# Patient Record
Sex: Female | Born: 2000 | Race: White | Hispanic: No | Marital: Married | Smoking: Never smoker
Health system: Southern US, Community
[De-identification: ages and names within clinical notes are randomized; demographics above are authoritative.]

## PROBLEM LIST (undated history)

## (undated) ENCOUNTER — Inpatient Hospital Stay (HOSPITAL_COMMUNITY): Payer: Self-pay

## (undated) DIAGNOSIS — I10 Essential (primary) hypertension: Secondary | ICD-10-CM

## (undated) DIAGNOSIS — T1490XA Injury, unspecified, initial encounter: Secondary | ICD-10-CM

## (undated) DIAGNOSIS — F329 Major depressive disorder, single episode, unspecified: Secondary | ICD-10-CM

## (undated) DIAGNOSIS — E875 Hyperkalemia: Secondary | ICD-10-CM

## (undated) DIAGNOSIS — F32A Depression, unspecified: Secondary | ICD-10-CM

## (undated) DIAGNOSIS — T7422XA Child sexual abuse, confirmed, initial encounter: Secondary | ICD-10-CM

## (undated) DIAGNOSIS — F419 Anxiety disorder, unspecified: Secondary | ICD-10-CM

## (undated) HISTORY — DX: Major depressive disorder, single episode, unspecified: F32.9

## (undated) HISTORY — DX: Essential (primary) hypertension: I10

## (undated) HISTORY — DX: Child sexual abuse, confirmed, initial encounter: T74.22XA

## (undated) HISTORY — DX: Depression, unspecified: F32.A

## (undated) HISTORY — DX: Injury, unspecified, initial encounter: T14.90XA

## (undated) HISTORY — DX: Anxiety disorder, unspecified: F41.9

## (undated) HISTORY — DX: Hyperkalemia: E87.5

## (undated) HISTORY — PX: TYMPANOSTOMY: SHX2586

---
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
## 2020-01-04 DIAGNOSIS — H5213 Myopia, bilateral: Secondary | ICD-10-CM | POA: Diagnosis not present

## 2020-01-15 ENCOUNTER — Other Ambulatory Visit: Payer: Self-pay

## 2020-01-15 ENCOUNTER — Emergency Department (HOSPITAL_COMMUNITY)
Admission: EM | Admit: 2020-01-15 | Discharge: 2020-01-15 | Disposition: A | Discharge status: Home / Self Care | Payer: Medicaid Other | Attending: Emergency Medicine | Admitting: Emergency Medicine

## 2020-01-15 ENCOUNTER — Emergency Department (HOSPITAL_COMMUNITY): Payer: Medicaid Other

## 2020-01-15 ENCOUNTER — Encounter (HOSPITAL_COMMUNITY): Payer: Self-pay

## 2020-01-15 DIAGNOSIS — R102 Pelvic and perineal pain: Secondary | ICD-10-CM | POA: Insufficient documentation

## 2020-01-15 DIAGNOSIS — R197 Diarrhea, unspecified: Secondary | ICD-10-CM

## 2020-01-15 DIAGNOSIS — Z79899 Other long term (current) drug therapy: Secondary | ICD-10-CM | POA: Diagnosis not present

## 2020-01-15 DIAGNOSIS — R112 Nausea with vomiting, unspecified: Secondary | ICD-10-CM | POA: Diagnosis present

## 2020-01-15 DIAGNOSIS — K2900 Acute gastritis without bleeding: Secondary | ICD-10-CM

## 2020-01-15 DIAGNOSIS — R109 Unspecified abdominal pain: Secondary | ICD-10-CM | POA: Diagnosis not present

## 2020-01-15 LAB — URINALYSIS, ROUTINE W REFLEX MICROSCOPIC
Bilirubin Urine: NEGATIVE
Glucose, UA: NEGATIVE mg/dL
Hgb urine dipstick: NEGATIVE
Ketones, ur: NEGATIVE mg/dL
Leukocytes,Ua: NEGATIVE
Nitrite: NEGATIVE
Protein, ur: NEGATIVE mg/dL
Specific Gravity, Urine: 1.024 (ref 1.005–1.030)
pH: 5 (ref 5.0–8.0)

## 2020-01-15 LAB — CBC
HCT: 48.2 % — ABNORMAL HIGH (ref 36.0–46.0)
Hemoglobin: 15.2 g/dL — ABNORMAL HIGH (ref 12.0–15.0)
MCH: 26.3 pg (ref 26.0–34.0)
MCHC: 31.5 g/dL (ref 30.0–36.0)
MCV: 83.5 fL (ref 80.0–100.0)
Platelets: 431 10*3/uL — ABNORMAL HIGH (ref 150–400)
RBC: 5.77 MIL/uL — ABNORMAL HIGH (ref 3.87–5.11)
RDW: 14 % (ref 11.5–15.5)
WBC: 8.7 10*3/uL (ref 4.0–10.5)
nRBC: 0 % (ref 0.0–0.2)

## 2020-01-15 LAB — COMPREHENSIVE METABOLIC PANEL
ALT: 31 U/L (ref 0–44)
AST: 22 U/L (ref 15–41)
Albumin: 4.4 g/dL (ref 3.5–5.0)
Alkaline Phosphatase: 77 U/L (ref 38–126)
Anion gap: 9 (ref 5–15)
BUN: 12 mg/dL (ref 6–20)
CO2: 23 mmol/L (ref 22–32)
Calcium: 9.8 mg/dL (ref 8.9–10.3)
Chloride: 109 mmol/L (ref 98–111)
Creatinine, Ser: 0.82 mg/dL (ref 0.44–1.00)
GFR calc Af Amer: >60 mL/min (ref >60)
GFR calc non Af Amer: >60 mL/min (ref >60)
Glucose, Bld: 95 mg/dL (ref 70–99)
Potassium: 3.9 mmol/L (ref 3.5–5.1)
Sodium: 141 mmol/L (ref 135–145)
Total Bilirubin: 0.5 mg/dL (ref 0.3–1.2)
Total Protein: 8 g/dL (ref 6.5–8.1)

## 2020-01-15 LAB — I-STAT BETA HCG BLOOD, ED (MC, WL, AP ONLY): I-stat hCG, quantitative: <5 m[IU]/mL (ref <5)

## 2020-01-15 LAB — LIPASE, BLOOD: Lipase: 31 U/L (ref 11–51)

## 2020-01-15 MED ORDER — DICYCLOMINE HCL 20 MG PO TABS: 20.0000 mg | ORAL_TABLET | Freq: Two times a day (BID) | ORAL | 0 refills | Status: DC

## 2020-01-15 MED ORDER — SODIUM CHLORIDE 0.9% FLUSH: 3.0000 mL | Freq: Once | INTRAVENOUS | Status: DC

## 2020-01-15 MED ORDER — ONDANSETRON 4 MG PO TBDP: 4.0000 mg | ORAL_TABLET | Freq: Three times a day (TID) | ORAL | 0 refills | Status: DC | PRN

## 2020-01-15 MED ORDER — ONDANSETRON HCL 4 MG/2ML IJ SOLN
4.0000 mg | Freq: Once | INTRAMUSCULAR | Status: AC
  Administered 2020-01-15: 19:00:00 4 mg via INTRAVENOUS
  Filled 2020-01-15: qty 2

## 2020-01-15 MED ORDER — IOHEXOL 300 MG/ML  SOLN
100.0000 mL | Freq: Once | INTRAMUSCULAR | Status: AC | PRN
  Administered 2020-01-15: 19:00:00 100 mL via INTRAVENOUS

## 2020-01-15 MED ORDER — SODIUM CHLORIDE 0.9 % IV BOLUS
1000.0000 mL | Freq: Once | INTRAVENOUS | Status: AC
  Administered 2020-01-15: 19:00:00 1000 mL via INTRAVENOUS

## 2020-01-15 MED ORDER — DICYCLOMINE HCL 10 MG/ML IM SOLN
20.0000 mg | Freq: Once | INTRAMUSCULAR | Status: AC
  Administered 2020-01-15: 20 mg via INTRAMUSCULAR
  Filled 2020-01-15: qty 2

## 2020-01-15 NOTE — ED Triage Notes (Signed)
Pt presents with c/o abdominal pain. Pt reports the pain is at her c-section site and lower abdomen. Pt's c-section was 8 months ago. Pt reports vomiting as well.
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
## 2020-01-15 NOTE — Discharge Instructions (Signed)
Take the Zofran and Bentyl as needed for your symptoms. Slowly advance your diet as tolerated make sure you are drinking plenty of fluids. Return to the ER if you start to experience worsening abdominal pain, diarrhea or vomiting despite taking the medications, shortness of breath or chest pain.

## 2020-03-02 ENCOUNTER — Ambulatory Visit (INDEPENDENT_AMBULATORY_CARE_PROVIDER_SITE_OTHER): Payer: Medicaid Other | Admitting: *Deleted

## 2020-03-02 DIAGNOSIS — Z308 Encounter for other contraceptive management: Secondary | ICD-10-CM

## 2020-03-02 MED ORDER — MEDROXYPROGESTERONE ACETATE 150 MG/ML IM SUSP
150.0000 mg | Freq: Once | INTRAMUSCULAR | Status: AC
  Administered 2020-03-02: 150 mg via INTRAMUSCULAR

## 2020-03-02 NOTE — Progress Notes (Signed)
   NURSE VISIT- INJECTION  SUBJECTIVE:  Erica Colon is a 19 y.o. G89P1001 female here for a Depo Provera for contraception/period management. She is a GYN patient.   OBJECTIVE:  There were no vitals taken for this visit.  Appears well, in no apparent distress  Injection administered in: Right deltoid  No orders of the defined types were placed in this encounter.   ASSESSMENT: GYN patient Depo Provera for contraception/period management PLAN: Follow-up: in 11-13 weeks for next Depo   Stoney Bang  03/02/2020 11:17 AM

## 2020-03-05 ENCOUNTER — Ambulatory Visit: Payer: Medicaid Other

## 2020-03-05 DIAGNOSIS — Z03818 Encounter for observation for suspected exposure to other biological agents ruled out: Secondary | ICD-10-CM | POA: Diagnosis not present

## 2020-03-05 DIAGNOSIS — Z20828 Contact with and (suspected) exposure to other viral communicable diseases: Secondary | ICD-10-CM | POA: Diagnosis not present

## 2020-03-19 ENCOUNTER — Ambulatory Visit: Payer: 59 | Admitting: Adult Health

## 2020-03-26 ENCOUNTER — Encounter: Payer: Self-pay | Admitting: Adult Health

## 2020-03-26 ENCOUNTER — Ambulatory Visit (INDEPENDENT_AMBULATORY_CARE_PROVIDER_SITE_OTHER): Payer: Medicaid Other | Admitting: Adult Health

## 2020-03-26 VITALS — BP 125/81 | HR 89 | Ht 62.0 in | Wt 179.0 lb

## 2020-03-26 DIAGNOSIS — Z3042 Encounter for surveillance of injectable contraceptive: Secondary | ICD-10-CM | POA: Diagnosis not present

## 2020-03-26 DIAGNOSIS — F419 Anxiety disorder, unspecified: Secondary | ICD-10-CM | POA: Insufficient documentation

## 2020-03-26 MED ORDER — SERTRALINE HCL 50 MG PO TABS: 50.0000 mg | ORAL_TABLET | Freq: Every day | ORAL | 12 refills | Status: DC

## 2020-03-26 MED ORDER — MEDROXYPROGESTERONE ACETATE 150 MG/ML IM SUSP: 150.0000 mg | INTRAMUSCULAR | 4 refills | Status: DC

## 2020-03-26 NOTE — Progress Notes (Signed)
  Subjective:     Patient ID: Erica Colon, female   DOB: 2001/03/17, 19 y.o.   MRN: [id number]  HPI Erica Colon is a 19 year old white female, engaged, G1P1 in for 6 month follow up on taking zoloft for postpartum depression, and she stopped a few months ago, not depressed but has anxiety now. PCP is Jannifer Rodney NP.  Review of Systems Patient denies any headaches, hearing loss, blurred vision, shortness of breath, chest pain, abdominal pain, problems with bowel movements, urination, or intercourse. No joint pain or mood swings. Has anxiety, but not depression now, has been off zoloft a few months   + tired today, is working and caring for little girl. She is happy with depo  Objective:   Physical Exam BP 125/81 (BP Location: Left Arm, Patient Position: Sitting, Cuff Size: Normal)   Pulse 89   Ht 5\' 2"  (1.575 m)   Wt 179 lb (81.2 kg)   BMI 32.74 kg/m  Skin warm and dry. Lungs: clear to ausculation bilaterally. Cardiovascular: regular rate and rhythm.   PHQ 2 score is 1.  Assessment:     1. Encounter for surveillance of injectable contraceptive Refilled depo Meds ordered this encounter  Medications  . medroxyPROGESTERone (DEPO-PROVERA) 150 MG/ML injection    Sig: Inject 1 mL (150 mg total) into the muscle every 3 (three) months.    Dispense:  1 mL    Refill:  4    Order Specific Question:   Supervising Provider    Answer:   , LUTHER H [[id number]]  . sertraline (ZOLOFT) 50 MG tablet    Sig: Take 1 tablet (50 mg total) by mouth daily.    Dispense:  30 tablet    Refill:  12    Order Specific Question:   Supervising Provider    Answer:   Despina Hidden H [[id number]]    2. Anxiety Will resume zoloft Follow up with me when gets depo 8/17     Plan:     Follow up 05/25/18 for depo and see me for ROS on zoloft for anxiety

## 2020-05-25 ENCOUNTER — Ambulatory Visit (INDEPENDENT_AMBULATORY_CARE_PROVIDER_SITE_OTHER): Payer: Medicaid Other | Admitting: Adult Health

## 2020-05-25 ENCOUNTER — Encounter: Payer: Self-pay | Admitting: Adult Health

## 2020-05-25 ENCOUNTER — Other Ambulatory Visit: Payer: Self-pay

## 2020-05-25 VITALS — BP 124/76 | HR 82 | Ht 62.0 in | Wt 167.5 lb

## 2020-05-25 DIAGNOSIS — F419 Anxiety disorder, unspecified: Secondary | ICD-10-CM

## 2020-05-25 DIAGNOSIS — Z3042 Encounter for surveillance of injectable contraceptive: Secondary | ICD-10-CM

## 2020-05-25 MED ORDER — MEDROXYPROGESTERONE ACETATE 150 MG/ML IM SUSP
150.0000 mg | Freq: Once | INTRAMUSCULAR | Status: AC
  Administered 2020-05-25: 150 mg via INTRAMUSCULAR

## 2020-05-25 NOTE — Progress Notes (Signed)
  Subjective:     Patient ID: Erica Colon, female   DOB: 2000-10-19, 19 y.o.   MRN: [id number]  HPI Erica Colon is a 19 year old white female,single, G1P1, in today for depo and follow up on starting Zoloft 50 mg for anxiety, and is doing much better. PCP is Erica Rodney NP  Review of Systems Anxiety is much better,on zoloft  Reviewed past medical,surgical, social and family history. Reviewed medications and allergies.     Objective:   Physical Exam BP 124/76 (BP Location: Left Arm, Patient Position: Sitting, Cuff Size: Normal)   Pulse 82   Ht 5\' 2"  (1.575 m)   Wt 167 lb 8 oz (76 kg)   Breastfeeding No   BMI 30.64 kg/m    Skin warm and dry.  Lungs: clear to ausculation bilaterally. Cardiovascular: regular rate and rhythm. PHQ 9 score is 2 She received depo from LPN  Assessment:     1. Encounter for surveillance of injectable contraceptive Pt received depo today, follow up in 12 weeks for next depo  2. Anxiety Continue Zoloft 50 mg      Plan:     Follow up in 6 months with me, or sooner if needed

## 2020-06-16 ENCOUNTER — Telehealth: Payer: Self-pay

## 2020-06-16 NOTE — Telephone Encounter (Signed)
Pain in stomach,constepation then diarherea   , pain with intercourse, started about the time she started the anti-depressant medication.

## 2020-06-16 NOTE — Telephone Encounter (Signed)
Telephoned patient at home number. Patient states having diarrhea, constipation, and also painful intercourse. Advised patient to schedule appointment. Patient to schedule.

## 2020-06-25 ENCOUNTER — Ambulatory Visit (INDEPENDENT_AMBULATORY_CARE_PROVIDER_SITE_OTHER): Payer: Medicaid Other | Admitting: Adult Health

## 2020-06-25 ENCOUNTER — Encounter: Payer: Self-pay | Admitting: Adult Health

## 2020-06-25 VITALS — BP 125/81 | HR 81 | Ht 62.0 in | Wt 166.0 lb

## 2020-06-25 DIAGNOSIS — R309 Painful micturition, unspecified: Secondary | ICD-10-CM | POA: Diagnosis not present

## 2020-06-25 DIAGNOSIS — R102 Pelvic and perineal pain: Secondary | ICD-10-CM | POA: Diagnosis not present

## 2020-06-25 DIAGNOSIS — N941 Unspecified dyspareunia: Secondary | ICD-10-CM | POA: Insufficient documentation

## 2020-06-25 DIAGNOSIS — R110 Nausea: Secondary | ICD-10-CM | POA: Insufficient documentation

## 2020-06-25 DIAGNOSIS — R1013 Epigastric pain: Secondary | ICD-10-CM | POA: Insufficient documentation

## 2020-06-25 LAB — POCT URINALYSIS DIPSTICK
Blood, UA: NEGATIVE
Glucose, UA: NEGATIVE
Ketones, UA: NEGATIVE
Leukocytes, UA: NEGATIVE
Nitrite, UA: NEGATIVE
Protein, UA: NEGATIVE

## 2020-06-25 MED ORDER — DICYCLOMINE HCL 10 MG PO CAPS: 10.0000 mg | ORAL_CAPSULE | Freq: Three times a day (TID) | ORAL | 3 refills | Status: DC

## 2020-06-25 NOTE — Progress Notes (Signed)
  Subjective:     Patient ID: Erica Colon, female   DOB: 12/17/2000, 19 y.o.   MRN: [id number]  HPI Erica Colon is a 19 year old white female, engaged, G1P1 in complaining of pain in abdomen and pelvis and has nausea, with occasional  vomiting and diarrhea or constipation and not associated with anything. Has pain with sex. Has had same partner for 2 years. She stopped zoloft yesterday, thinking it was related to above. No current PCP.  Review of Systems Pain in abdomen and pelvis, nausea with occ. Vomiting and diarrhea or constipation Has pain with sex Reviewed past medical,surgical, social and family history. Reviewed medications and allergies.     Objective:   Physical Exam BP 125/81 (BP Location: Left Arm, Patient Position: Sitting, Cuff Size: Normal)   Pulse 81   Ht 5\' 2"  (1.575 m)   Wt 166 lb (75.3 kg)   BMI 30.36 kg/m    Urine dipstick is negative. Skin warm and dry.Abdomen is soft and has tenderness mid epigastric and LLQ.Pelvic: external genitalia is normal in appearance no lesions, vagina: pink, with good moisture and rugae ,urethra has no lesions or masses noted, cervix:smooth and bulbous, uterus: normal size, shape and contour, non tender, no masses felt, adnexa: no masses or tenderness noted. Bladder is non tender and no masses felt.  Examination chaperoned by LPN   [facility] - 06/25/20 0905      Pregnancy Intention Screening   Does the patient want to become pregnant in the next year? No    Does the patient's partner want to become pregnant in the next year? No    Would the patient like to discuss contraceptive options today? No      Contraception Wrap Up   Current Method Hormonal Injection    End Method Hormonal Injection    Contraception Counseling Provided No             Assessment:     1. Painful urination  2. Dyspareunia in female   3. Pelvic pain Will get pelvic 06/27/20 at [facility] 9/24 at 8:30 am  4. Epigastric pain Will get abdominal 10/24 at  [facility] 9/24 at 8:30 am be NPO  5. Nausea Will try bentyl  Meds ordered this encounter  Medications  . dicyclomine (BENTYL) 10 MG capsule    Sig: Take 1 capsule (10 mg total) by mouth 4 (four) times daily -  before meals and at bedtime.    Dispense:  120 capsule    Refill:  3    Order Specific Question:   Supervising Provider    Answer:   10/24 [[id number]]      Plan:     Restart Zoloft    -follow up in 3 weeks

## 2020-07-02 ENCOUNTER — Other Ambulatory Visit: Payer: Self-pay

## 2020-07-02 ENCOUNTER — Other Ambulatory Visit: Payer: Self-pay | Admitting: Adult Health

## 2020-07-02 ENCOUNTER — Ambulatory Visit (HOSPITAL_COMMUNITY)
Admission: RE | Admit: 2020-07-02 | Discharge: 2020-07-02 | Disposition: A | Discharge status: Home / Self Care | Payer: Medicaid Other | Source: Ambulatory Visit | Attending: Adult Health | Admitting: Adult Health

## 2020-07-02 DIAGNOSIS — R110 Nausea: Secondary | ICD-10-CM

## 2020-07-02 DIAGNOSIS — R1013 Epigastric pain: Secondary | ICD-10-CM | POA: Insufficient documentation

## 2020-07-02 DIAGNOSIS — R102 Pelvic and perineal pain: Secondary | ICD-10-CM | POA: Insufficient documentation

## 2020-07-02 DIAGNOSIS — R309 Painful micturition, unspecified: Secondary | ICD-10-CM | POA: Diagnosis not present

## 2020-07-02 DIAGNOSIS — N941 Unspecified dyspareunia: Secondary | ICD-10-CM | POA: Diagnosis not present

## 2020-07-05 ENCOUNTER — Other Ambulatory Visit: Payer: Self-pay | Admitting: Women's Health

## 2020-07-05 MED ORDER — ORILISSA 200 MG PO TABS: 200.0000 mg | ORAL_TABLET | Freq: Two times a day (BID) | ORAL | 5 refills | Status: DC

## 2020-07-05 NOTE — Progress Notes (Signed)
Rx orilissa 200mg  BID for possible endometrosis pain w/ dyspareunia. Neg pelvic and abd u/s. Has f/u w/ JAG on 10/8. On depo for birth control.  12/8, CNM, [facility] 07/05/2020 2:19 PM

## 2020-07-16 ENCOUNTER — Encounter: Payer: Self-pay | Admitting: Adult Health

## 2020-07-16 ENCOUNTER — Ambulatory Visit (INDEPENDENT_AMBULATORY_CARE_PROVIDER_SITE_OTHER): Payer: Medicaid Other | Admitting: Adult Health

## 2020-07-16 VITALS — BP 109/73 | HR 78 | Ht 62.0 in | Wt 166.0 lb

## 2020-07-16 DIAGNOSIS — R102 Pelvic and perineal pain: Secondary | ICD-10-CM

## 2020-07-16 DIAGNOSIS — N941 Unspecified dyspareunia: Secondary | ICD-10-CM

## 2020-07-16 NOTE — Progress Notes (Signed)
°  Subjective:     Patient ID: Erica Colon, female   DOB: 04/18/2001, 19 y.o.   MRN: [id number]  HPI Erica Colon is a 19 year old white female, single, G1P1001, back in follow up on pelvic pain, nausea and pain with sex. She was placed on Orlissa the end of September and stopped bentyl and did not restart Zoloft, and she still has pain and nausea.  No current PCP.   Review of Systems Still has pelvic pain and nausea  Pain with sex No pain with urination now  but needs to go often and work complains  Reviewed past medical,surgical, social and family history. Reviewed medications and allergies.     Objective:   Physical Exam BP 109/73 (BP Location: Left Arm, Patient Position: Sitting, Cuff Size: Normal)    Pulse 78    Ht 5\' 2"  (1.575 m)    Wt 166 lb (75.3 kg)    BMI 30.36 kg/m  Skin warm and dry.  Lungs: clear to ausculation bilaterally. Cardiovascular: regular rate and rhythm.   Reviewed abdominal and pelvic with her, both were normal.  Assessment:     1. Pelvic pain ?endometriosis On orlissa 200 mg 1 bid   2. Dyspareunia in female Continue orlissa,has refills     Plan:    Restart bentyl Given note for work for frequent bathroom breaks Follow up in 8 weeks or sooner if needed

## 2020-07-21 ENCOUNTER — Other Ambulatory Visit: Payer: Self-pay | Admitting: Adult Health

## 2020-07-21 DIAGNOSIS — R110 Nausea: Secondary | ICD-10-CM

## 2020-07-21 DIAGNOSIS — R1013 Epigastric pain: Secondary | ICD-10-CM

## 2020-07-21 NOTE — Progress Notes (Signed)
Will refer to GI.

## 2020-07-27 ENCOUNTER — Encounter: Payer: Self-pay | Admitting: Internal Medicine

## 2020-08-17 ENCOUNTER — Ambulatory Visit (INDEPENDENT_AMBULATORY_CARE_PROVIDER_SITE_OTHER): Payer: Medicaid Other | Admitting: *Deleted

## 2020-08-17 DIAGNOSIS — Z3042 Encounter for surveillance of injectable contraceptive: Secondary | ICD-10-CM | POA: Diagnosis not present

## 2020-08-17 MED ORDER — MEDROXYPROGESTERONE ACETATE 150 MG/ML IM SUSP
150.0000 mg | Freq: Once | INTRAMUSCULAR | Status: AC
  Administered 2020-08-17: 150 mg via INTRAMUSCULAR

## 2020-08-25 ENCOUNTER — Ambulatory Visit: Payer: Medicaid Other

## 2020-09-01 ENCOUNTER — Encounter: Payer: Self-pay | Admitting: Gastroenterology

## 2020-09-01 ENCOUNTER — Other Ambulatory Visit: Payer: Self-pay

## 2020-09-01 ENCOUNTER — Ambulatory Visit (INDEPENDENT_AMBULATORY_CARE_PROVIDER_SITE_OTHER): Payer: Medicaid Other | Admitting: Gastroenterology

## 2020-09-01 VITALS — BP 128/78 | HR 82 | Temp 97.1°F | Ht 62.0 in | Wt 162.6 lb

## 2020-09-01 DIAGNOSIS — R110 Nausea: Secondary | ICD-10-CM

## 2020-09-01 DIAGNOSIS — R198 Other specified symptoms and signs involving the digestive system and abdomen: Secondary | ICD-10-CM

## 2020-09-01 DIAGNOSIS — R1030 Lower abdominal pain, unspecified: Secondary | ICD-10-CM | POA: Diagnosis not present

## 2020-09-01 DIAGNOSIS — R109 Unspecified abdominal pain: Secondary | ICD-10-CM | POA: Insufficient documentation

## 2020-09-01 MED ORDER — LUBIPROSTONE 24 MCG PO CAPS: 24.0000 ug | ORAL_CAPSULE | Freq: Two times a day (BID) | ORAL | 3 refills | Status: DC

## 2020-09-01 NOTE — Patient Instructions (Signed)
1. Start Amitiza for constipation and possible IBS. You should try taking once daily with food to begin with. If your stools are adequate then continue at this dose. If you are still having constipation, you can increase to twice daily with food.  2. You may have loose stools in the beginning when starting Amitiza. If you do, try to take every other day and stools should even out.  3. Please have your labs done. We will be in touch with results as available.

## 2020-09-01 NOTE — Progress Notes (Signed)
NO PCP PER PATIENT °
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
## 2020-09-10 ENCOUNTER — Ambulatory Visit: Payer: Medicaid Other | Admitting: Adult Health

## 2020-09-22 ENCOUNTER — Ambulatory Visit: Payer: Medicaid Other | Admitting: Adult Health

## 2020-09-29 ENCOUNTER — Ambulatory Visit: Payer: Medicaid Other | Admitting: Adult Health

## 2020-10-19 ENCOUNTER — Ambulatory Visit: Payer: Medicaid Other | Admitting: Adult Health

## 2020-11-03 ENCOUNTER — Other Ambulatory Visit: Payer: Self-pay

## 2020-11-03 ENCOUNTER — Ambulatory Visit (INDEPENDENT_AMBULATORY_CARE_PROVIDER_SITE_OTHER): Payer: Medicaid Other | Admitting: *Deleted

## 2020-11-03 DIAGNOSIS — Z308 Encounter for other contraceptive management: Secondary | ICD-10-CM | POA: Diagnosis not present

## 2020-11-03 MED ORDER — MEDROXYPROGESTERONE ACETATE 150 MG/ML IM SUSP
150.0000 mg | Freq: Once | INTRAMUSCULAR | Status: AC
  Administered 2020-11-03: 150 mg via INTRAMUSCULAR

## 2020-11-03 NOTE — Progress Notes (Signed)
   NURSE VISIT- INJECTION  SUBJECTIVE:  Erica Colon is a 20 y.o. G83P1001 female here for a Depo Provera for contraception/period management. She is a GYN patient.   OBJECTIVE:  There were no vitals taken for this visit.  Appears well, in no apparent distress  Injection administered in: Right deltoid  Meds ordered this encounter  Medications  . medroxyPROGESTERone (DEPO-PROVERA) injection 150 mg    ASSESSMENT: GYN patient Depo Provera for contraception/period management PLAN: Follow-up: in 11-13 weeks for next Depo   [name]  11/03/2020 10:52 AM

## 2020-11-25 DIAGNOSIS — R110 Nausea: Secondary | ICD-10-CM | POA: Diagnosis not present

## 2020-11-25 DIAGNOSIS — R198 Other specified symptoms and signs involving the digestive system and abdomen: Secondary | ICD-10-CM | POA: Diagnosis not present

## 2020-11-25 DIAGNOSIS — R1030 Lower abdominal pain, unspecified: Secondary | ICD-10-CM | POA: Diagnosis not present

## 2020-11-26 LAB — IGA: IgA/Immunoglobulin A, Serum: 207 mg/dL (ref 87–352)

## 2020-11-26 LAB — CBC WITH DIFFERENTIAL/PLATELET
Basophils Absolute: 0.1 10*3/uL (ref 0.0–0.2)
Basos: 1 %
EOS (ABSOLUTE): 0.1 10*3/uL (ref 0.0–0.4)
Eos: 1 %
Hematocrit: 47.5 % — ABNORMAL HIGH (ref 34.0–46.6)
Hemoglobin: 15.6 g/dL (ref 11.1–15.9)
Immature Grans (Abs): 0 10*3/uL (ref 0.0–0.1)
Immature Granulocytes: 0 %
Lymphocytes Absolute: 3.4 10*3/uL — ABNORMAL HIGH (ref 0.7–3.1)
Lymphs: 39 %
MCH: 27.9 pg (ref 26.6–33.0)
MCHC: 32.8 g/dL (ref 31.5–35.7)
MCV: 85 fL (ref 79–97)
Monocytes Absolute: 0.9 10*3/uL (ref 0.1–0.9)
Monocytes: 10 %
Neutrophils Absolute: 4.2 10*3/uL (ref 1.4–7.0)
Neutrophils: 49 %
Platelets: 406 10*3/uL (ref 150–450)
RBC: 5.59 x10E6/uL — ABNORMAL HIGH (ref 3.77–5.28)
RDW: 13.9 % (ref 11.7–15.4)
WBC: 8.6 10*3/uL (ref 3.4–10.8)

## 2020-11-26 LAB — TSH+FREE T4
Free T4: 1.36 ng/dL (ref 0.93–1.60)
TSH: 1.34 u[IU]/mL (ref 0.450–4.500)

## 2020-11-26 LAB — C-REACTIVE PROTEIN: CRP: 3 mg/L (ref 0–10)

## 2020-11-26 LAB — SEDIMENTATION RATE: Sed Rate: 15 mm/hr (ref 0–32)

## 2020-11-26 LAB — TISSUE TRANSGLUTAMINASE, IGA: Transglutaminase IgA: <2 U/mL (ref 0–3)

## 2021-01-26 ENCOUNTER — Other Ambulatory Visit: Payer: Self-pay

## 2021-01-26 ENCOUNTER — Ambulatory Visit (INDEPENDENT_AMBULATORY_CARE_PROVIDER_SITE_OTHER): Payer: Medicaid Other | Admitting: *Deleted

## 2021-01-26 DIAGNOSIS — Z3042 Encounter for surveillance of injectable contraceptive: Secondary | ICD-10-CM

## 2021-01-26 MED ORDER — MEDROXYPROGESTERONE ACETATE 150 MG/ML IM SUSP
150.0000 mg | Freq: Once | INTRAMUSCULAR | Status: AC
  Administered 2021-01-26: 150 mg via INTRAMUSCULAR

## 2021-01-26 NOTE — Progress Notes (Signed)
   NURSE VISIT- INJECTION  SUBJECTIVE:  Erica Colon is a 20 y.o. G49P1001 female here for a Depo Provera for contraception/period management. She is a GYN patient.   OBJECTIVE:  There were no vitals taken for this visit.  Appears well, in no apparent distress  Injection administered in: Right deltoid  Meds ordered this encounter  Medications  . medroxyPROGESTERone (DEPO-PROVERA) injection 150 mg    ASSESSMENT: GYN patient Depo Provera for contraception/period management PLAN: Follow-up: in 11-13 weeks for next Depo   Annamarie Dawley  01/26/2021 10:38 AM

## 2021-02-22 ENCOUNTER — Telehealth: Payer: Self-pay | Admitting: Adult Health

## 2021-02-22 NOTE — Telephone Encounter (Signed)
Pt is on depo, has been for 2 years Went to bathroom today & saw blood States no cycle since being on depo Looked like dry blood & some spotting when she wiped  Requested a call from [place]  Please advise & notify pt

## 2021-04-19 ENCOUNTER — Other Ambulatory Visit: Payer: Self-pay | Admitting: Adult Health

## 2021-04-19 ENCOUNTER — Telehealth: Payer: Self-pay | Admitting: Adult Health

## 2021-04-19 NOTE — Telephone Encounter (Signed)
Pt needs refill on her depo, is scheduled to get the injection tomorrow  Please advise & notify pt  (Routed to [place] due to short on nurses)   [facility]

## 2021-04-19 NOTE — Telephone Encounter (Signed)
Pt aware that [facility]

## 2021-04-20 ENCOUNTER — Other Ambulatory Visit: Payer: Self-pay

## 2021-04-20 ENCOUNTER — Ambulatory Visit (INDEPENDENT_AMBULATORY_CARE_PROVIDER_SITE_OTHER): Payer: Medicaid Other | Admitting: *Deleted

## 2021-04-20 DIAGNOSIS — Z308 Encounter for other contraceptive management: Secondary | ICD-10-CM | POA: Diagnosis not present

## 2021-04-20 DIAGNOSIS — Z3202 Encounter for pregnancy test, result negative: Secondary | ICD-10-CM | POA: Diagnosis not present

## 2021-04-20 LAB — POCT URINE PREGNANCY: Preg Test, Ur: NEGATIVE

## 2021-04-20 MED ORDER — MEDROXYPROGESTERONE ACETATE 150 MG/ML IM SUSP
150.0000 mg | Freq: Once | INTRAMUSCULAR | Status: AC
  Administered 2021-04-20: 150 mg via INTRAMUSCULAR

## 2021-04-20 NOTE — Progress Notes (Signed)
   NURSE VISIT- INJECTION  SUBJECTIVE:  Erica Colon is a 20 y.o. G45P1001 female here for a Depo Provera for contraception/period management. She is a GYN patient.   OBJECTIVE:  There were no vitals taken for this visit.  Appears well, in no apparent distress. Pt mentioned having nausea, stomach and back pain. She requested a pregnancy test. UPT negative in office.  Injection administered in: Left deltoid  Meds ordered this encounter  Medications   medroxyPROGESTERone (DEPO-PROVERA) injection 150 mg    ASSESSMENT: GYN patient Depo Provera for contraception/period management PLAN: Follow-up: in 11-13 weeks for next Depo   [name]  04/20/2021 10:26 AM

## 2021-07-07 IMAGING — CT CT ABD-PELV W/ CM
2 of 4 series · 16 of 46 positions shown, 18 images · IV contrast (omnipaque)
Comparison: None.

CLINICAL DATA: 18-year-old female with right lower quadrant
abdominal pain.

EXAM:
CT ABDOMEN AND PELVIS WITH CONTRAST
TECHNIQUE: Multidetector CT imaging of the abdomen and pelvis was performed
using the standard protocol following bolus administration of
intravenous contrast.
CONTRAST:  100mL OMNIPAQUE IOHEXOL 300 MG/ML  SOLN

[Series 2: axial st · axial · 0.68mm/px · z∈[+914,+1334]mm · 13 of 94 slices shown, 15 images]
[im 5/94  soft-tissue]
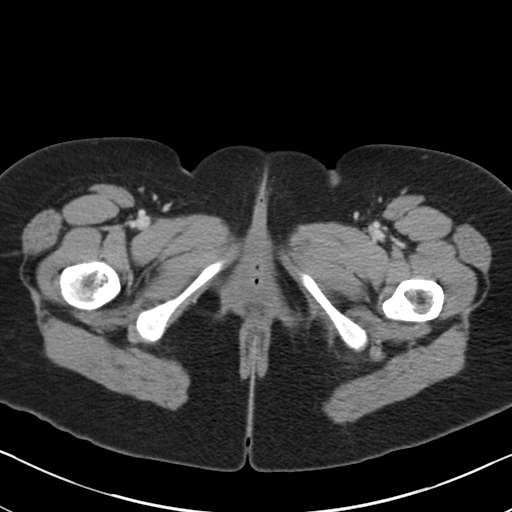
[im 5/94  bone]
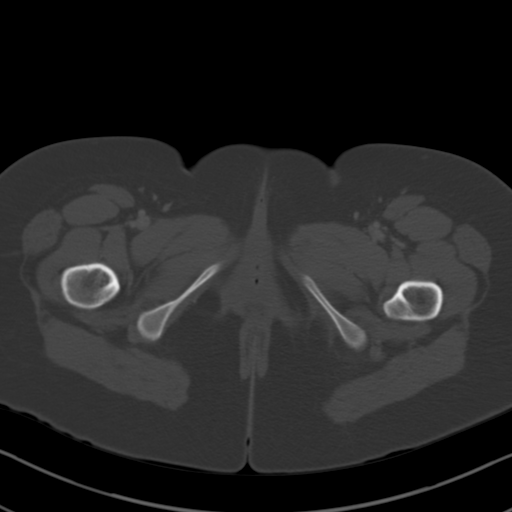
[im 14/94  soft-tissue]
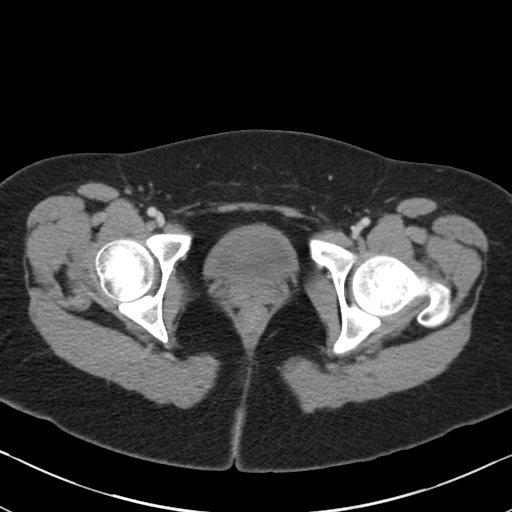
[im 19/94  soft-tissue]
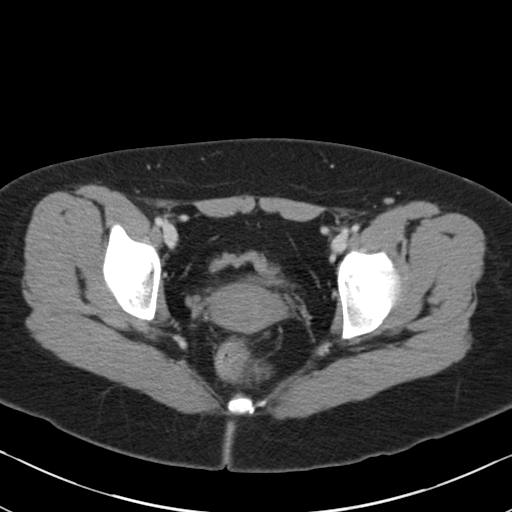
[im 28/94  soft-tissue]
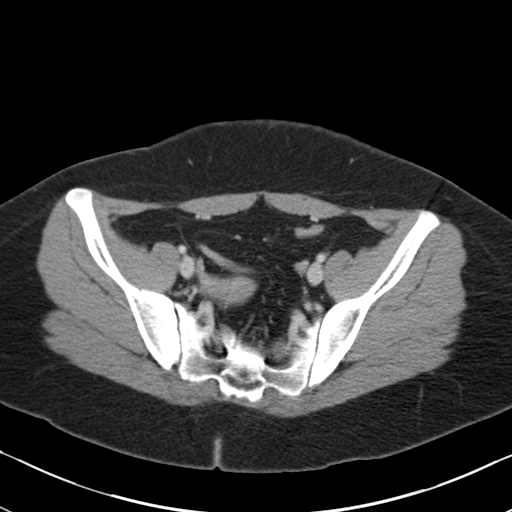
[im 33/94  soft-tissue]
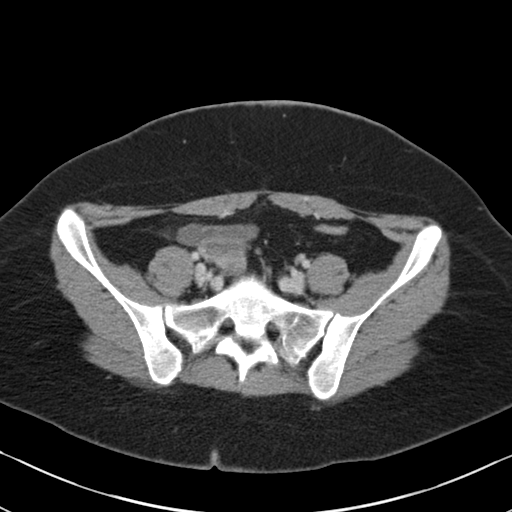
[im 42/94  soft-tissue]
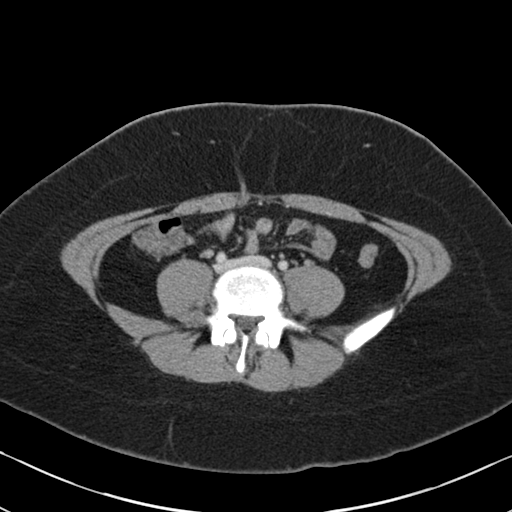
[im 47/94  soft-tissue]
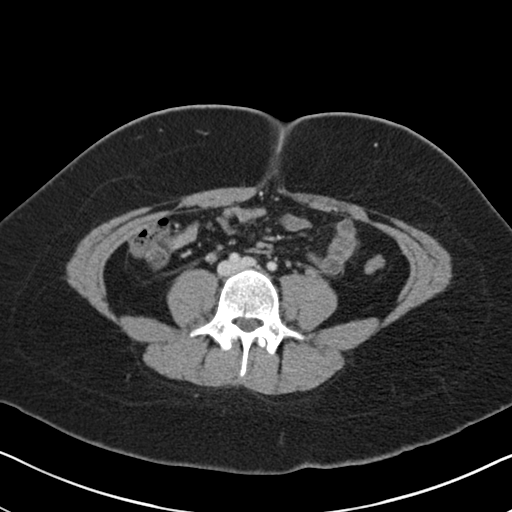
[im 52/94  soft-tissue]
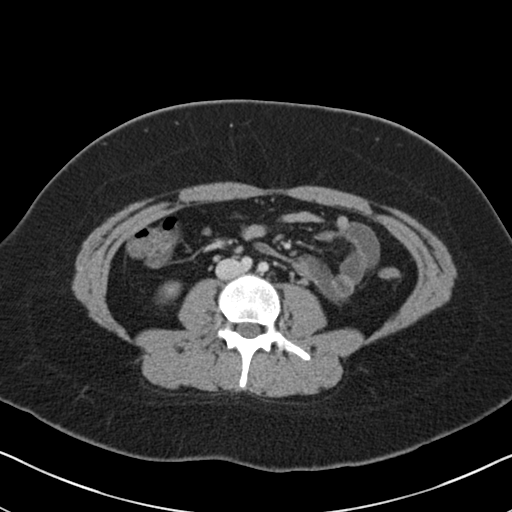
[im 61/94  soft-tissue]
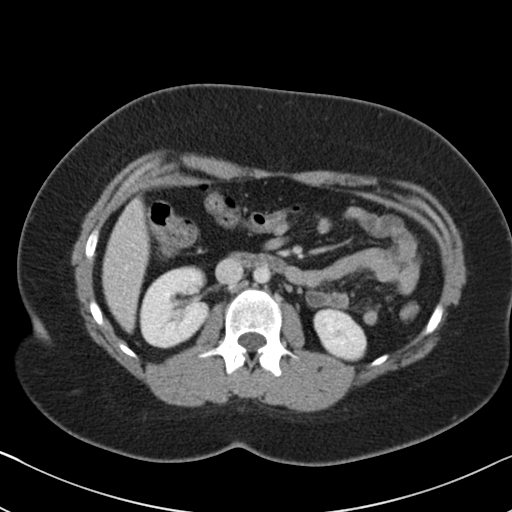
[im 61/94  bone]
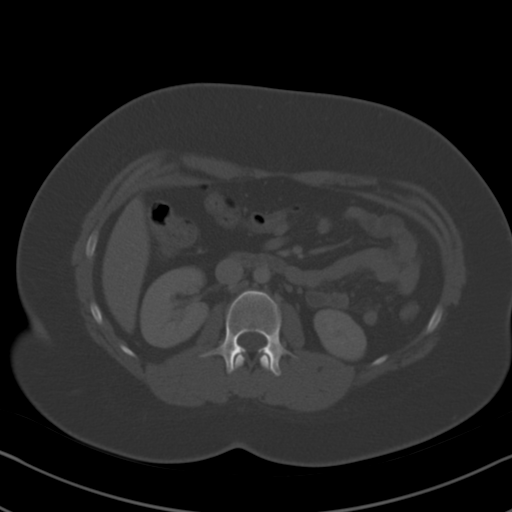
[im 66/94  soft-tissue]
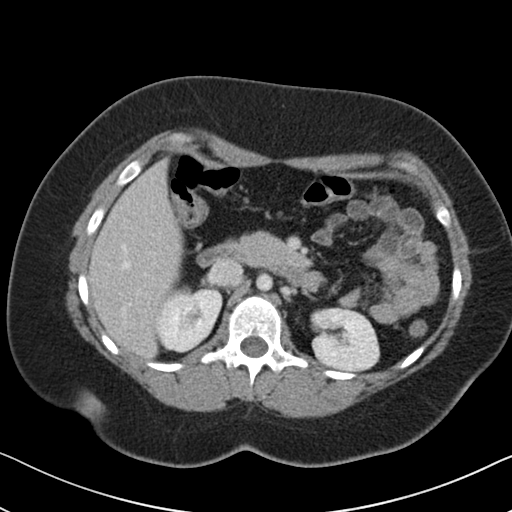
[im 75/94  soft-tissue]
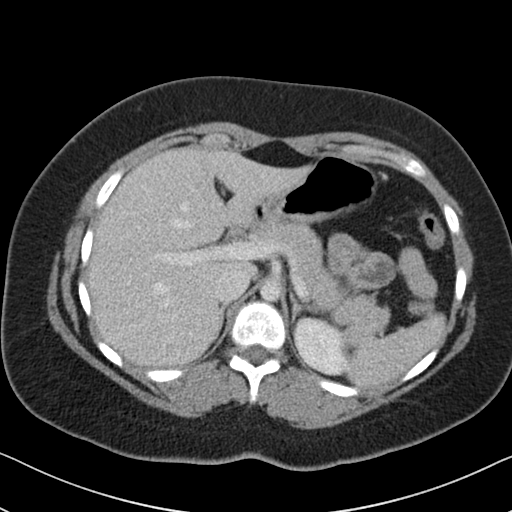
[im 80/94  soft-tissue]
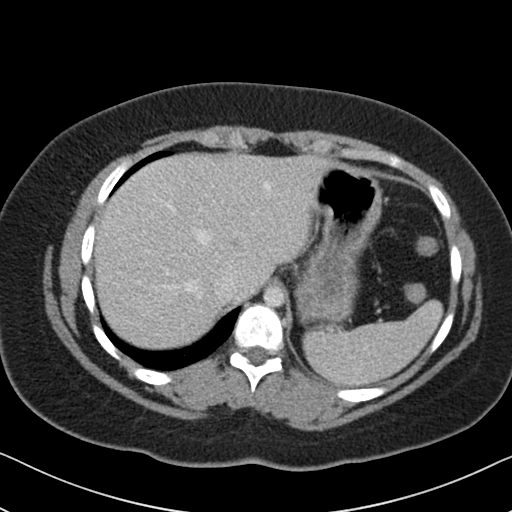
[im 89/94  soft-tissue]
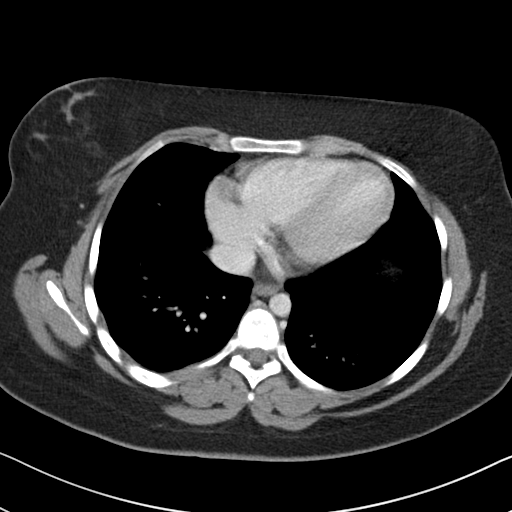

[Series 5: coronal st · coronal · 0.67mm/px · 3 of 129 slices shown]
[im 43/129  soft-tissue]
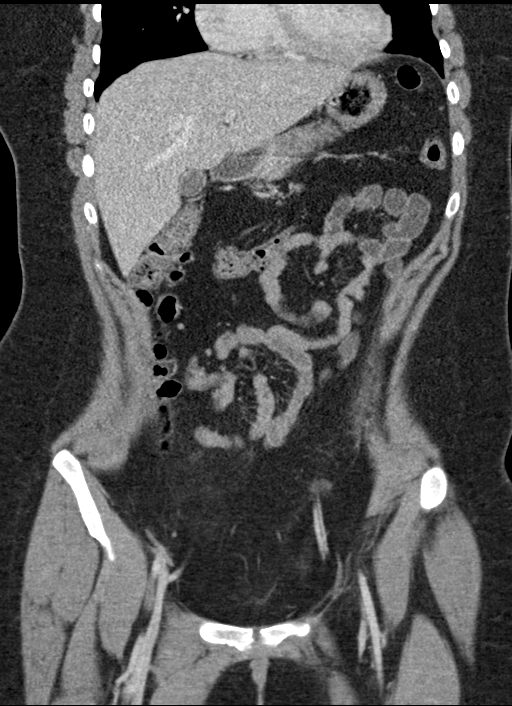
[im 57/129  soft-tissue]
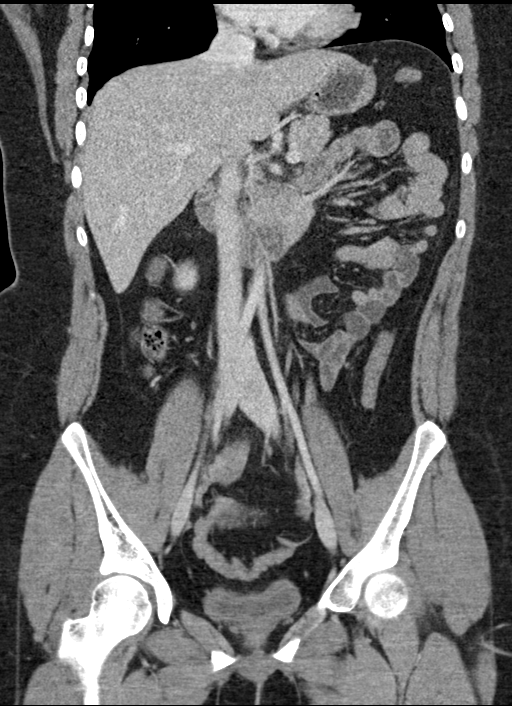
[im 72/129  soft-tissue]
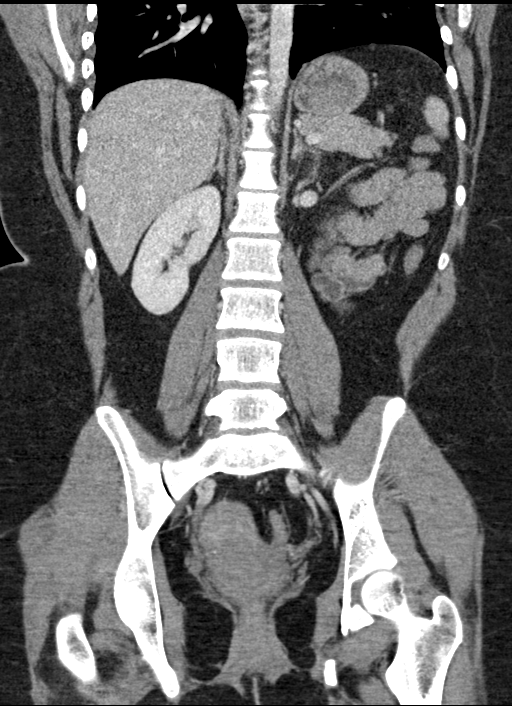

[16 of 46 positions shown; findings below may reference images not displayed]

FINDINGS: Lower chest: The visualized lung bases are clear.

No intra-abdominal free air or free fluid.

Hepatobiliary: No focal liver abnormality is seen. No gallstones,
gallbladder wall thickening, or biliary dilatation.

Pancreas: Unremarkable. No pancreatic ductal dilatation or
surrounding inflammatory changes.

Spleen: Normal in size without focal abnormality.

Adrenals/Urinary Tract: Adrenal glands are unremarkable. Kidneys are
normal, without renal calculi, focal lesion, or hydronephrosis.
Bladder is unremarkable.

Stomach/Bowel: There is no bowel obstruction or active inflammation.
The appendix is normal.

Vascular/Lymphatic: The abdominal aorta and IVC are unremarkable. No
portal venous gas. There is no adenopathy.

Reproductive: The uterus and ovaries are grossly unremarkable.

Other: None

Musculoskeletal: No acute or significant osseous findings.
IMPRESSION: No acute intra-abdominal or pelvic pathology. Normal appendix.

## 2021-07-13 ENCOUNTER — Ambulatory Visit (INDEPENDENT_AMBULATORY_CARE_PROVIDER_SITE_OTHER): Payer: Medicaid Other | Admitting: *Deleted

## 2021-07-13 ENCOUNTER — Other Ambulatory Visit: Payer: Self-pay

## 2021-07-13 DIAGNOSIS — Z308 Encounter for other contraceptive management: Secondary | ICD-10-CM | POA: Diagnosis not present

## 2021-07-13 MED ORDER — MEDROXYPROGESTERONE ACETATE 150 MG/ML IM SUSP
150.0000 mg | Freq: Once | INTRAMUSCULAR | Status: AC
  Administered 2021-07-13: 150 mg via INTRAMUSCULAR

## 2021-07-13 NOTE — Progress Notes (Signed)
   NURSE VISIT- INJECTION  SUBJECTIVE:  Erica Colon is a 20 y.o. G60P1001 female here for a Depo Provera for contraception/period management. She is a GYN patient.   OBJECTIVE:  There were no vitals taken for this visit.  Appears well, in no apparent distress  Injection administered in: Right deltoid  Meds ordered this encounter  Medications   medroxyPROGESTERone (DEPO-PROVERA) injection 150 mg    ASSESSMENT: GYN patient Depo Provera for contraception/period management PLAN: Follow-up: in 11-13 weeks for next Depo   [name]  07/13/2021 11:33 AM

## 2021-07-20 ENCOUNTER — Encounter (HOSPITAL_BASED_OUTPATIENT_CLINIC_OR_DEPARTMENT_OTHER): Payer: Self-pay | Admitting: Obstetrics and Gynecology

## 2021-07-20 ENCOUNTER — Emergency Department (HOSPITAL_BASED_OUTPATIENT_CLINIC_OR_DEPARTMENT_OTHER)
Admission: EM | Admit: 2021-07-20 | Discharge: 2021-07-20 | Disposition: A | Discharge status: Home / Self Care | Payer: Medicaid Other | Attending: Emergency Medicine | Admitting: Emergency Medicine

## 2021-07-20 ENCOUNTER — Other Ambulatory Visit: Payer: Self-pay

## 2021-07-20 DIAGNOSIS — N390 Urinary tract infection, site not specified: Secondary | ICD-10-CM | POA: Diagnosis not present

## 2021-07-20 DIAGNOSIS — R300 Dysuria: Secondary | ICD-10-CM | POA: Diagnosis present

## 2021-07-20 LAB — URINALYSIS, ROUTINE W REFLEX MICROSCOPIC
Bilirubin Urine: NEGATIVE
Glucose, UA: NEGATIVE mg/dL
Hgb urine dipstick: NEGATIVE
Ketones, ur: NEGATIVE mg/dL
Nitrite: NEGATIVE
Protein, ur: 30 mg/dL — AB
Specific Gravity, Urine: 1.031 — ABNORMAL HIGH (ref 1.005–1.030)
pH: 6 (ref 5.0–8.0)

## 2021-07-20 LAB — PREGNANCY, URINE: Preg Test, Ur: NEGATIVE

## 2021-07-20 MED ORDER — CEPHALEXIN 500 MG PO CAPS: 500.0000 mg | ORAL_CAPSULE | Freq: Two times a day (BID) | ORAL | 0 refills | Status: AC

## 2021-07-20 MED ORDER — PHENAZOPYRIDINE HCL 200 MG PO TABS: 200.0000 mg | ORAL_TABLET | Freq: Three times a day (TID) | ORAL | 0 refills | Status: DC | PRN

## 2021-07-20 NOTE — Discharge Instructions (Addendum)
Take Keflex as prescribed and complete the full course.  You can take Pyridium as prescribed for pain.  This medication does turn your urine and other bodily fluids orange. Recommend recheck with your gynecologist.

## 2021-07-20 NOTE — ED Triage Notes (Signed)
Patient reports she was given a Depo shot recently and has been having vaginal bleeding, pelvic pain, and overall not feeling well. Patient also reports she recently fell backwards and hit her head. Patient reports pain with intercourse, denies N/V/D.
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
## 2021-07-21 ENCOUNTER — Telehealth: Payer: Self-pay

## 2021-07-21 NOTE — Telephone Encounter (Signed)
Transition Care Management Unsuccessful Follow-up Telephone Call  Date of discharge and from where:  07/20/2021 from [facility]  Attempts:  1st Attempt  Reason for unsuccessful TCM follow-up call:  Left voice message    

## 2021-07-22 LAB — URINE CULTURE

## 2021-07-22 NOTE — Telephone Encounter (Signed)
Transition Care Management Unsuccessful Follow-up Telephone Call  Date of discharge and from where:  07/20/2021 from [facility]  Attempts:  2nd Attempt  Reason for unsuccessful TCM follow-up call:  Left voice message

## 2021-07-25 NOTE — Telephone Encounter (Signed)
Transition Care Management Follow-up Telephone Call Date of discharge and from where: 07/20/2021 from [facility] How have you been since you were released from the hospital? Pt stated that she is feeling a whole lot better. Pt stated that she is still taking the abx and has not had any issue while taking them.  Any questions or concerns? No  Items Reviewed: Did the pt receive and understand the discharge instructions provided? Yes  Medications obtained and verified? Yes  Other? No  Any new allergies since your discharge? No  Dietary orders reviewed? No Do you have support at home? Yes   Functional Questionnaire: (I = Independent and D = Dependent) ADLs: I  Bathing/Dressing- I  Meal Prep- I  Eating- I  Maintaining continence- I  Transferring/Ambulation- I  Managing Meds- I   Follow up appointments reviewed:  PCP Hospital f/u appt confirmed? No   Specialist Hospital f/u appt confirmed? No   Are transportation arrangements needed? No  If their condition worsens, is the pt aware to call PCP or go to the Emergency Dept.? Yes Was the patient provided with contact information for the PCP's office or ED? Yes Was to pt encouraged to call back with questions or concerns? Yes

## 2021-10-05 ENCOUNTER — Ambulatory Visit (INDEPENDENT_AMBULATORY_CARE_PROVIDER_SITE_OTHER): Payer: Medicaid Other | Admitting: *Deleted

## 2021-10-05 ENCOUNTER — Encounter: Payer: Self-pay | Admitting: Adult Health

## 2021-10-05 ENCOUNTER — Other Ambulatory Visit: Payer: Self-pay

## 2021-10-05 ENCOUNTER — Ambulatory Visit (INDEPENDENT_AMBULATORY_CARE_PROVIDER_SITE_OTHER): Payer: Medicaid Other | Admitting: Adult Health

## 2021-10-05 VITALS — BP 135/87 | HR 82 | Ht 62.0 in | Wt 165.0 lb

## 2021-10-05 DIAGNOSIS — F32A Depression, unspecified: Secondary | ICD-10-CM | POA: Diagnosis not present

## 2021-10-05 DIAGNOSIS — F419 Anxiety disorder, unspecified: Secondary | ICD-10-CM | POA: Diagnosis not present

## 2021-10-05 DIAGNOSIS — Z3042 Encounter for surveillance of injectable contraceptive: Secondary | ICD-10-CM | POA: Diagnosis not present

## 2021-10-05 MED ORDER — MEDROXYPROGESTERONE ACETATE 150 MG/ML IM SUSP
150.0000 mg | Freq: Once | INTRAMUSCULAR | Status: AC
  Administered 2021-10-05: 15:00:00 150 mg via INTRAMUSCULAR

## 2021-10-05 MED ORDER — SERTRALINE HCL 50 MG PO TABS: 50.0000 mg | ORAL_TABLET | Freq: Every day | ORAL | 3 refills | Status: DC

## 2021-10-05 NOTE — Progress Notes (Signed)
° °  NURSE VISIT- INJECTION  SUBJECTIVE:  Erica Colon is a 20 y.o. G57P1001 female here for a Depo Provera for contraception/period management. She is a GYN patient.   OBJECTIVE:  There were no vitals taken for this visit.  Appears well, in no apparent distress  Injection administered in: Left deltoid  Meds ordered this encounter  Medications   medroxyPROGESTERone (DEPO-PROVERA) injection 150 mg    ASSESSMENT: GYN patient Depo Provera for contraception/period management PLAN: Follow-up: in 11-13 weeks for next Depo   Annamarie Dawley  10/05/2021 2:57 PM

## 2021-10-05 NOTE — Progress Notes (Signed)
°  Subjective:     Patient ID: Erica Colon, female   DOB: 12/15/2000, 20 y.o.   MRN: [id number]  HPI Erica Colon is a 20 year old white female,single, G1P1 in for depo and told nurse she wants to see provider was depressed, she had postpartum depression.  Review of Systems +depression Reviewed past medical,surgical, social and family history. Reviewed medications and allergies.     Objective:   Physical Exam BP 135/87 (BP Location: Right Arm, Patient Position: Sitting, Cuff Size: Normal)    Pulse 82    Ht 5\' 2"  (1.575 m)    Wt 165 lb (74.8 kg)    BMI 30.18 kg/m     Skin warm and dry. Lungs: clear to ausculation bilaterally. Cardiovascular: regular rate and rhythm.  Depression screen [facility] 2/9 10/05/2021 07/16/2020 05/25/2020  Decreased Interest 2 0 0  Down, Depressed, Hopeless 2 0 0  PHQ - 2 Score 4 0 0  Altered sleeping 3 - 1  Tired, decreased energy 3 - 0  Change in appetite 3 - 1  Feeling bad or failure about yourself  2 - 0  Trouble concentrating 2 - 0  Moving slowly or fidgety/restless 2 - 0  Suicidal thoughts 0 - 0  PHQ-9 Score 19 - 2  Difficult doing work/chores Very difficult - -    GAD 7 : Generalized Anxiety Score 10/05/2021  Nervous, Anxious, on Edge 2  Control/stop worrying 2  Worry too much - different things 2  Trouble relaxing 2  Restless 2  Easily annoyed or irritable 2  Afraid - awful might happen 2  Total GAD 7 Score 14  Anxiety Difficulty Very difficult      Upstream - 10/05/21 1510       Pregnancy Intention Screening   Does the patient want to become pregnant in the next year? No    Does the patient's partner want to become pregnant in the next year? No    Would the patient like to discuss contraceptive options today? No      Contraception Wrap Up   Current Method Hormonal Injection    End Method Hormonal Injection    Contraception Counseling Provided No             Assessment:     1. Anxiety and depression Will rx zoloft she declines  counseling Meds ordered this encounter  Medications   sertraline (ZOLOFT) 50 MG tablet    Sig: Take 1 tablet (50 mg total) by mouth daily.    Dispense:  30 tablet    Refill:  3    Order Specific Question:   Supervising Provider    Answer:   10/07/21, LUTHER H [[id number]]     2. Encounter for surveillance of injectable contraceptive Continue depo,has refills She had depo injection today too     Plan:     Follow up in 6 weeks for ROS and 12 weeks for depo

## 2021-10-28 ENCOUNTER — Other Ambulatory Visit: Payer: Self-pay | Admitting: Adult Health

## 2021-11-17 ENCOUNTER — Encounter: Payer: Self-pay | Admitting: Adult Health

## 2021-11-17 ENCOUNTER — Other Ambulatory Visit: Payer: Self-pay

## 2021-11-17 ENCOUNTER — Ambulatory Visit (INDEPENDENT_AMBULATORY_CARE_PROVIDER_SITE_OTHER): Payer: Medicaid Other | Admitting: Adult Health

## 2021-11-17 VITALS — BP 128/88 | HR 87 | Ht 62.0 in | Wt 168.0 lb

## 2021-11-17 DIAGNOSIS — F419 Anxiety disorder, unspecified: Secondary | ICD-10-CM | POA: Insufficient documentation

## 2021-11-17 DIAGNOSIS — F32A Depression, unspecified: Secondary | ICD-10-CM

## 2021-11-17 DIAGNOSIS — N941 Unspecified dyspareunia: Secondary | ICD-10-CM | POA: Diagnosis not present

## 2021-11-17 NOTE — Progress Notes (Signed)
°  Subjective:     Patient ID: Erica Colon, female   DOB: Jan 26, 2001, 21 y.o.   MRN: [id number]  HPI Erica Colon is a 21 year old white female,single, G1P1 back in follow up on starting Zoloft for anxiety and depression and doing better.  She got depo 10/05/22 and is thinking of switching to nexplanon.  Review of Systems Anxiety and depression better Not sleeping well  Has pain with sex esp at entrance Reviewed past medical,surgical, social and family history. Reviewed medications and allergies.     Objective:   Physical Exam BP 128/88 (BP Location: Right Arm, Patient Position: Sitting, Cuff Size: Normal)    Pulse 87    Ht 5\' 2"  (1.575 m)    Wt 168 lb (76.2 kg)    BMI 30.73 kg/m     Skin warm and dry. Lungs: clear to ausculation bilaterally. Cardiovascular: regular rate and rhythm.  Depression screen [facility] 2/9 11/17/2021 10/05/2021 07/16/2020  Decreased Interest 1 2 0  Down, Depressed, Hopeless 0 2 0  PHQ - 2 Score 1 4 0  Altered sleeping 2 3 -  Tired, decreased energy 2 3 -  Change in appetite 2 3 -  Feeling bad or failure about yourself  1 2 -  Trouble concentrating 2 2 -  Moving slowly or fidgety/restless 1 2 -  Suicidal thoughts 0 0 -  PHQ-9 Score 11 19 -  Difficult doing work/chores - Very difficult -  Some recent data might be hidden    GAD 7 : Generalized Anxiety Score 11/17/2021 10/05/2021  Nervous, Anxious, on Edge 1 2  Control/stop worrying 1 2  Worry too much - different things 1 2  Trouble relaxing 1 2  Restless 0 2  Easily annoyed or irritable 1 2  Afraid - awful might happen 0 2  Total GAD 7 Score 5 14  Anxiety Difficulty - Very difficult      Upstream - 11/17/21 1021       Pregnancy Intention Screening   Does the patient want to become pregnant in the next year? No    Does the patient's partner want to become pregnant in the next year? No    Would the patient like to discuss contraceptive options today? No      Contraception Wrap Up   Current Method Hormonal  Injection    End Method Hormonal Injection    Contraception Counseling Provided No             Assessment:     1. Anxiety and depression Increase Zoloft to 75 mg , take take 1.5 tablets She has just gotten refill on Zoloft 50 mg of 90 tabs She declines counseling Try to rest during the day  2. Dyspareunia in female Use good lubricate, try warm cloth to vaginal area before sex     Plan:     Return 12/21/21 for nexplanon insertion and ROS

## 2021-12-08 ENCOUNTER — Other Ambulatory Visit: Payer: Self-pay

## 2021-12-08 ENCOUNTER — Other Ambulatory Visit: Payer: Medicaid Other

## 2021-12-08 ENCOUNTER — Other Ambulatory Visit (INDEPENDENT_AMBULATORY_CARE_PROVIDER_SITE_OTHER): Payer: Medicaid Other | Admitting: *Deleted

## 2021-12-08 ENCOUNTER — Other Ambulatory Visit (HOSPITAL_COMMUNITY)
Admission: RE | Admit: 2021-12-08 | Discharge: 2021-12-08 | Disposition: A | Discharge status: Home / Self Care | Payer: Medicaid Other | Source: Ambulatory Visit | Attending: Obstetrics & Gynecology | Admitting: Obstetrics & Gynecology

## 2021-12-08 DIAGNOSIS — N898 Other specified noninflammatory disorders of vagina: Secondary | ICD-10-CM | POA: Diagnosis not present

## 2021-12-08 DIAGNOSIS — R300 Dysuria: Secondary | ICD-10-CM

## 2021-12-08 LAB — POCT URINALYSIS DIPSTICK OB
Blood, UA: NEGATIVE
Glucose, UA: NEGATIVE
Ketones, UA: NEGATIVE
Nitrite, UA: NEGATIVE
POC,PROTEIN,UA: NEGATIVE

## 2021-12-08 NOTE — Progress Notes (Signed)
? ?  NURSE VISIT- VAGINITIS/STD/POC ? ?SUBJECTIVE:  ?Erica Colon is a 21 y.o. G1P1001 GYN patientfemale here for a vaginal swab for vaginitis screening.  She reports the following symptoms: discharge described as curd-like for several months. Also reports dysuria. ?Denies abnormal vaginal bleeding, significant pelvic pain, fever.  ? ?OBJECTIVE:  ?There were no vitals taken for this visit.  ?Appears well, in no apparent distress ? ?ASSESSMENT: ?Vaginal swab for vaginitis screening ? ?PLAN: ?Self-collected vaginal probe for Gonorrhea, Chlamydia, Trichomonas, Bacterial Vaginosis, Yeast sent to lab ?Also sent urine for culture. ?Treatment: to be determined once results are received ?Follow-up as needed if symptoms persist/worsen, or new symptoms develop ? ? ?Annamarie Dawley  ?12/08/2021 ?2:17 PM  ?

## 2021-12-09 LAB — URINALYSIS
Bilirubin, UA: NEGATIVE
Glucose, UA: NEGATIVE
Ketones, UA: NEGATIVE
Leukocytes,UA: NEGATIVE
Nitrite, UA: NEGATIVE
Protein,UA: NEGATIVE
RBC, UA: NEGATIVE
Specific Gravity, UA: 1.022 (ref 1.005–1.030)
Urobilinogen, Ur: 0.2 mg/dL (ref 0.2–1.0)
pH, UA: 7.5 (ref 5.0–7.5)

## 2021-12-11 LAB — URINE CULTURE

## 2021-12-12 LAB — CERVICOVAGINAL ANCILLARY ONLY
Bacterial Vaginitis (gardnerella): NEGATIVE
Candida Glabrata: NEGATIVE
Candida Vaginitis: NEGATIVE
Chlamydia: NEGATIVE
Comment: NEGATIVE
Comment: NEGATIVE
Comment: NEGATIVE
Comment: NEGATIVE
Comment: NEGATIVE
Comment: NORMAL
Neisseria Gonorrhea: NEGATIVE
Trichomonas: NEGATIVE

## 2021-12-21 ENCOUNTER — Encounter: Payer: Self-pay | Admitting: Adult Health

## 2021-12-21 ENCOUNTER — Other Ambulatory Visit: Payer: Self-pay

## 2021-12-21 ENCOUNTER — Ambulatory Visit (INDEPENDENT_AMBULATORY_CARE_PROVIDER_SITE_OTHER): Payer: Medicaid Other | Admitting: Adult Health

## 2021-12-21 VITALS — BP 130/90 | HR 88 | Ht 62.0 in | Wt 178.5 lb

## 2021-12-21 DIAGNOSIS — F419 Anxiety disorder, unspecified: Secondary | ICD-10-CM

## 2021-12-21 DIAGNOSIS — Z3202 Encounter for pregnancy test, result negative: Secondary | ICD-10-CM

## 2021-12-21 DIAGNOSIS — F32A Depression, unspecified: Secondary | ICD-10-CM

## 2021-12-21 DIAGNOSIS — Z30017 Encounter for initial prescription of implantable subdermal contraceptive: Secondary | ICD-10-CM

## 2021-12-21 LAB — POCT URINE PREGNANCY: Preg Test, Ur: NEGATIVE

## 2021-12-21 MED ORDER — ETONOGESTREL 68 MG ~~LOC~~ IMPL
68.0000 mg | DRUG_IMPLANT | Freq: Once | SUBCUTANEOUS | Status: AC
  Administered 2021-12-21: 68 mg via SUBCUTANEOUS

## 2021-12-21 NOTE — Patient Instructions (Signed)
Use condoms x 2 weeks, keep clean and dry x 24 hours, no heavy lifting, keep steri strips on x 72 hours, Keep pressure dressing on x 24 hours. Follow up prn problems.  

## 2021-12-21 NOTE — Progress Notes (Signed)
?  Subjective:  ?  ? Patient ID: Erica Colon, female   DOB: October 04, 2001, 21 y.o.   MRN: [id number] ? ?HPI ?Erica Colon is a 21 year old white female, single,G1P1 in for nexplanon insertion. Last depo 10/05/21, last sex 12/18/21. ?She is working at [employer] now. ? ?Review of Systems ?For nexplanon insertion ?Doing good on 75 mg Zoloft ? ?   Reviewed past medical,surgical, social and family history. Reviewed medications and allergies.  ?   ?Objective:  ? Physical Exam ?BP 130/90 (BP Location: Left Arm, Patient Position: Sitting, Cuff Size: Normal)   Pulse 88   Ht 5\' 2"  (1.575 m)   Wt 178 lb 8 oz (81 kg)   BMI 32.65 kg/m?  UPT is negative. ?Consent signed, time out called. Right arm cleansed with betadine, and injected with 1.5 cc 2% lidocaine and waited til numb. Nexplanon easily inserted and steri strips applied.Rod easily palpated by provider and pt. Pressure dressing applied.  ?  Fall risk is low ?Depression screen [facility] 2/9 12/21/2021 11/17/2021 10/05/2021 07/16/2020 05/25/2020  ?Decreased Interest 0 1 2 0 0  ?Down, Depressed, Hopeless 0 0 2 0 0  ?PHQ - 2 Score 0 1 4 0 0  ?Altered sleeping 2 2 3  - 1  ?Tired, decreased energy 2 2 3  - 0  ?Change in appetite 2 2 3  - 1  ?Feeling bad or failure about yourself  0 1 2 - 0  ?Trouble concentrating 0 2 2 - 0  ?Moving slowly or fidgety/restless 0 1 2 - 0  ?Suicidal thoughts 0 0 0 - 0  ?PHQ-9 Score 6 11 19  - 2  ?Difficult doing work/chores - - Very difficult - -  ?Some recent data might be hidden  ?  ? Upstream - 12/21/21 1041   ? ?  ? Pregnancy Intention Screening  ? Does the patient want to become pregnant in the next year? No   ? Does the patient's partner want to become pregnant in the next year? No   ? Would the patient like to discuss contraceptive options today? No   ?  ? Contraception Wrap Up  ? Current Method Hormonal Injection   ? End Method Hormonal Implant   ? ?  ?  ? ?  ?  ?Assessment:  ?   ?.   1. Pregnancy examination or test, negative result ? ? ?2. Nexplanon  insertion ?Lot ?Exp ? Use condoms x 2 weeks, keep clean and dry x 24 hours, no heavy lifting, keep steri strips on x 72 hours, Keep pressure dressing on x 24 hours. Follow up prn problems.  ? ?  3. Anxiety and depression ?Continue zoloft 75 mg daily ? ?Plan:  ?   ?Return in 3 months for pap and physical  ?   ?

## 2021-12-21 NOTE — Addendum Note (Signed)
Addended by: Colen Darling on: 12/21/2021 11:26 AM ? ? Modules accepted: Orders ? ?

## 2021-12-23 IMAGING — US US ABDOMEN LIMITED
1 series · 14 of 25 positions shown · non-contrast
Comparison: None.

CLINICAL DATA: Epigastric pain and nausea for 3 months.

EXAM:
ULTRASOUND ABDOMEN LIMITED RIGHT UPPER QUADRANT

[Series 1: us abdomen limited · 14 of 47 slices shown]
[im 1/47]
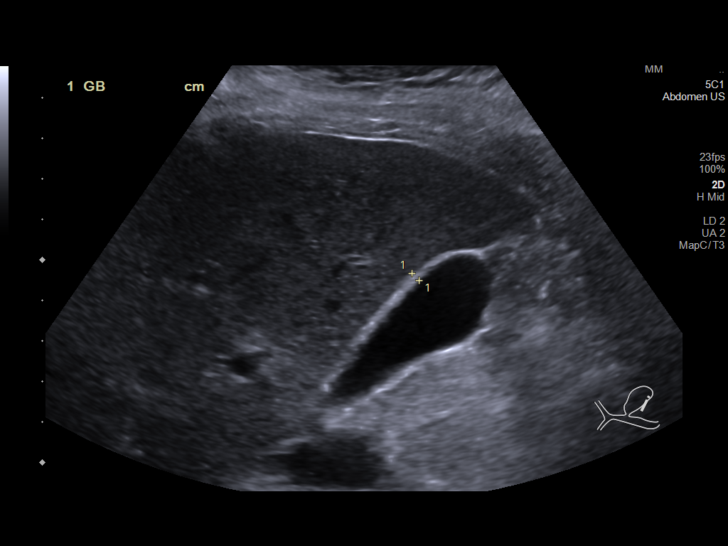
[im 4/47]
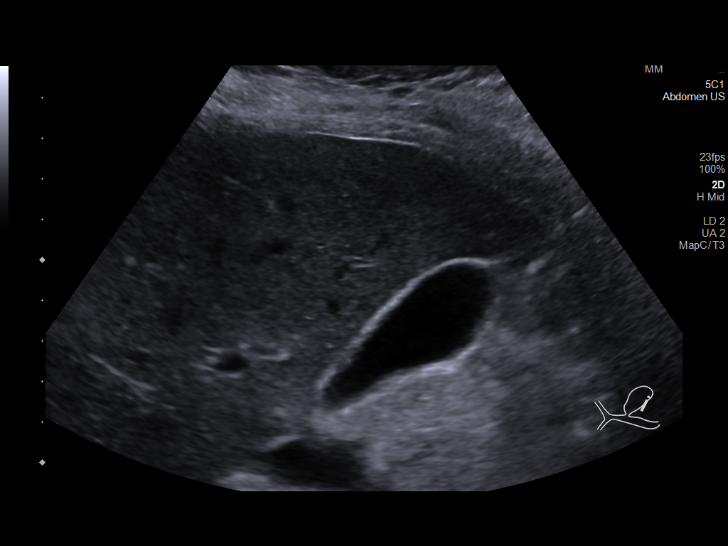
[im 8/47]
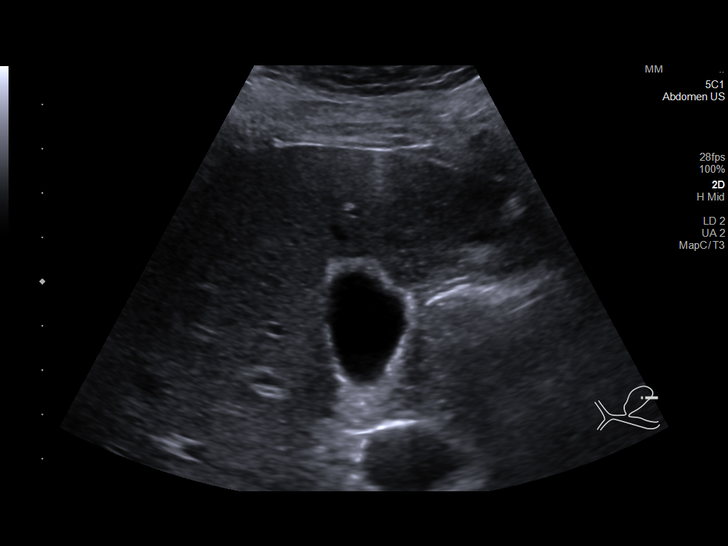
[im 12/47]
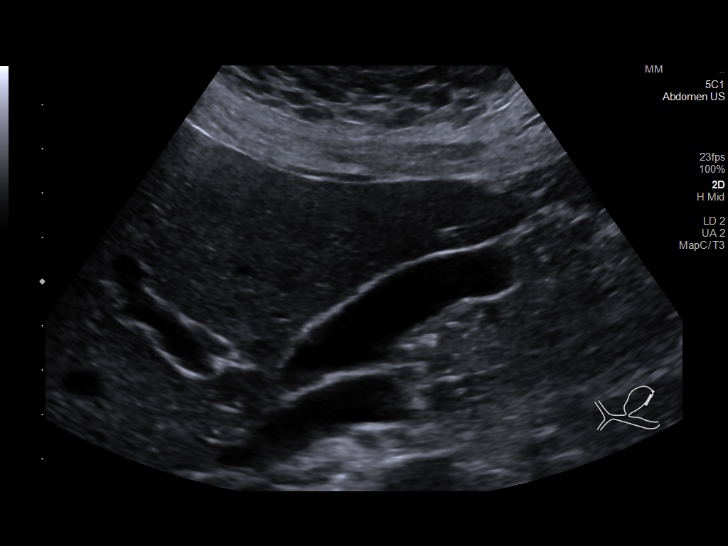
[im 16/47]
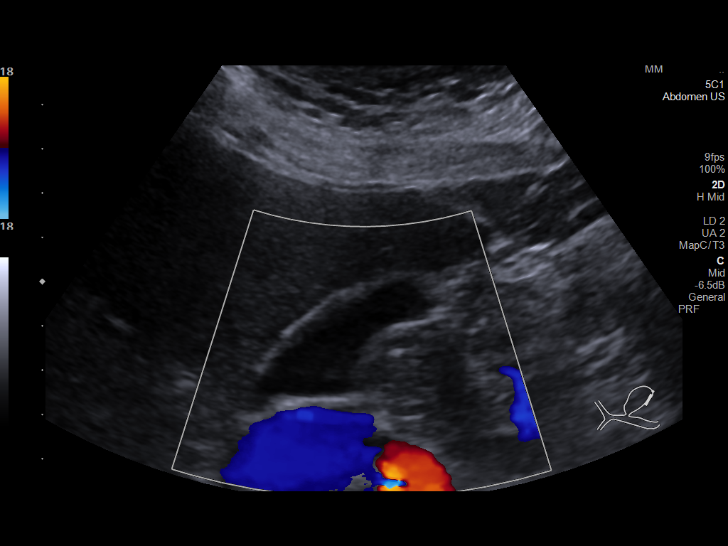
[im 18/47]
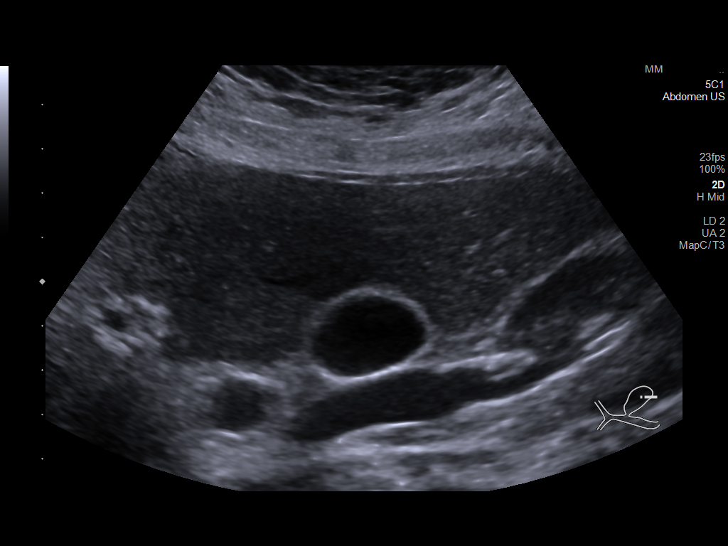
[im 22/47]
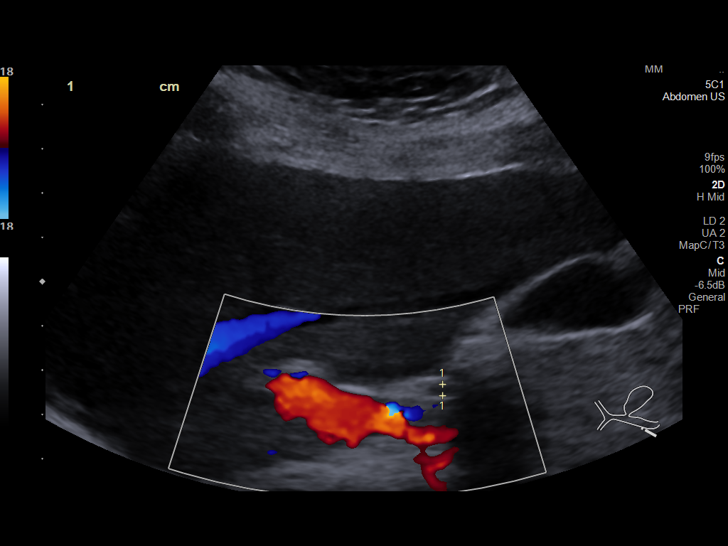
[im 25/47]
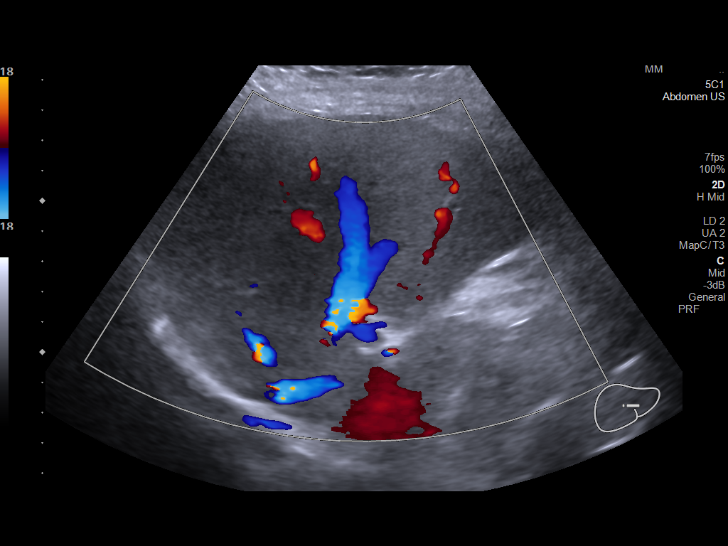
[im 29/47]
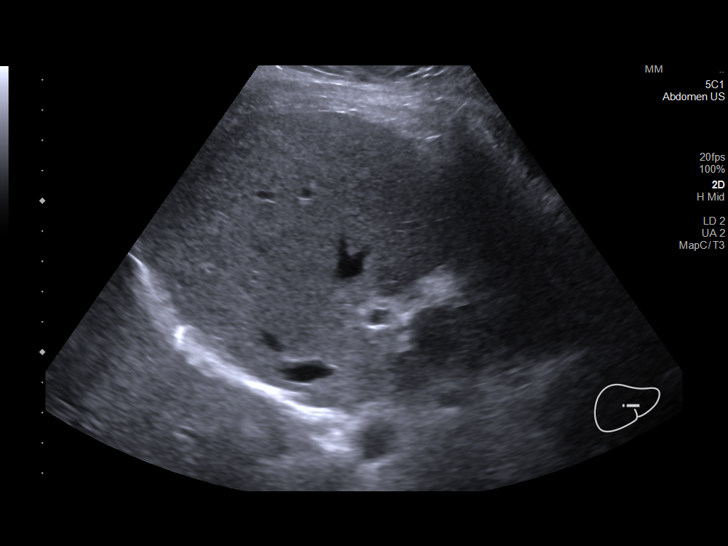
[im 31/47]
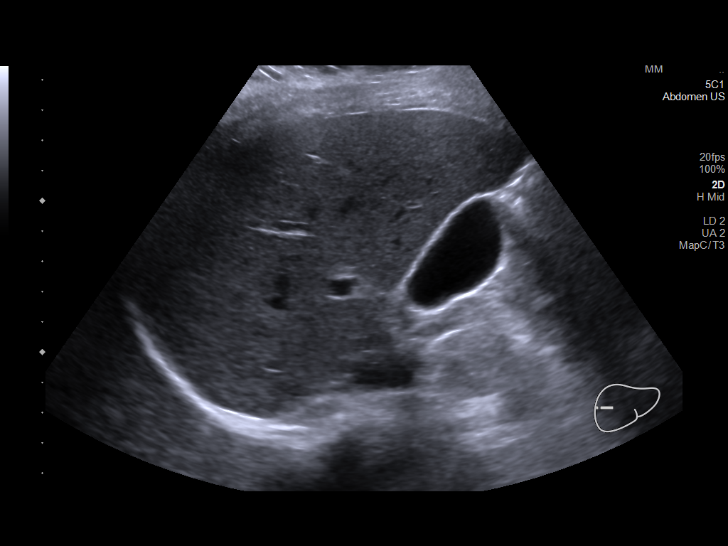
[im 35/47]
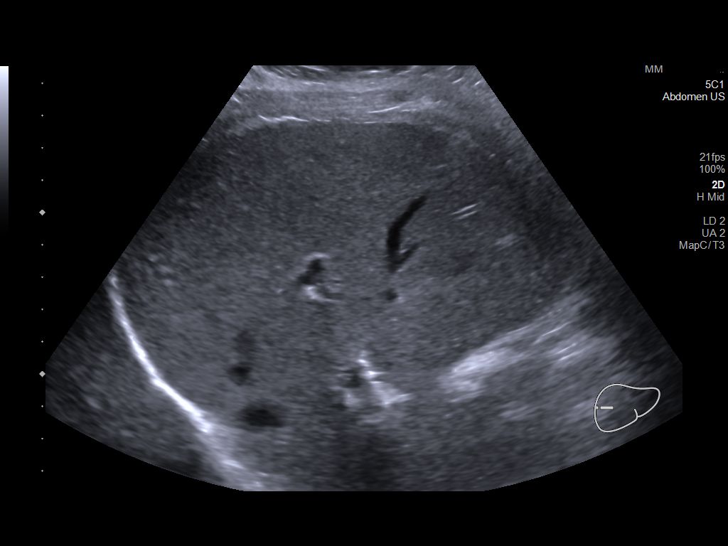
[im 39/47]
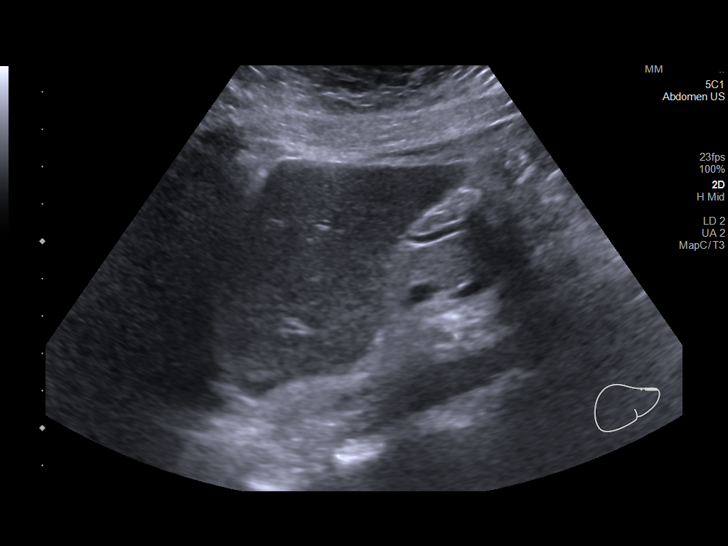
[im 43/47]
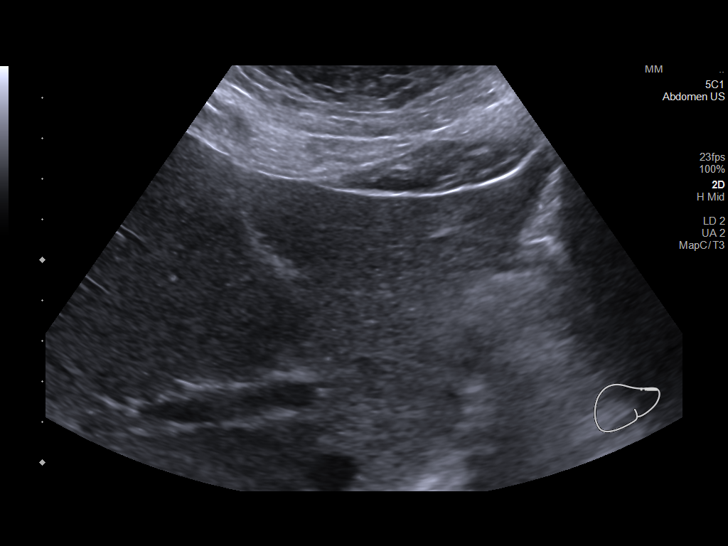
[im 47/47]
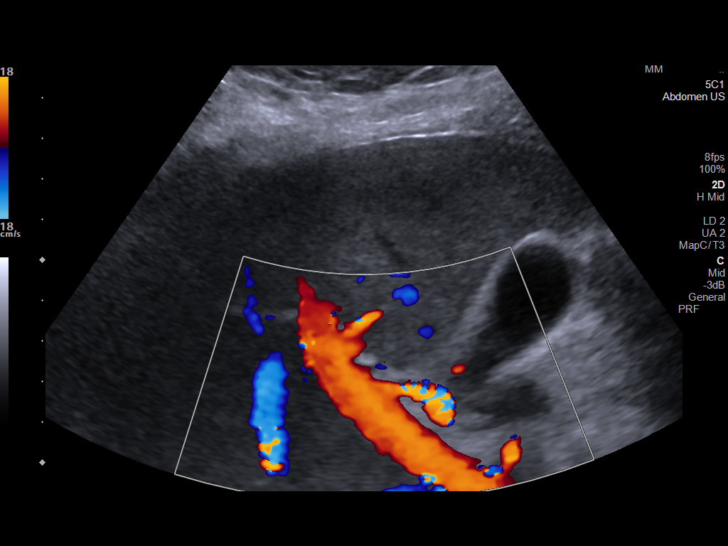

[14 of 25 positions shown; findings below may reference images not displayed]

FINDINGS: Gallbladder:

No gallstones or wall thickening visualized. No sonographic Murphy
sign noted by sonographer.

Common bile duct:

Diameter: 2.6 millimeters

Liver:

No focal lesion identified. Within normal limits in parenchymal
echogenicity. Portal vein is patent on color Doppler imaging with
normal direction of blood flow towards the liver.

Other: None.
IMPRESSION: Normal evaluation of the RIGHT UPPER QUADRANT.

## 2021-12-23 IMAGING — US US PELVIS COMPLETE
1 series · 14 of 25 positions shown · non-contrast
Comparison: None.

CLINICAL DATA: Mid pelvic pain for 3 months. Previous C-section in
in May 2019.

EXAM:
TRANSABDOMINAL ULTRASOUND OF PELVIS
TECHNIQUE: Transabdominal ultrasound examination of the pelvis was performed
including evaluation of the uterus, ovaries, adnexal regions, and
pelvic cul-de-sac.

[Series 1: us pelvic complete with transvaginal · 14 of 49 slices shown]
[im 1/49]
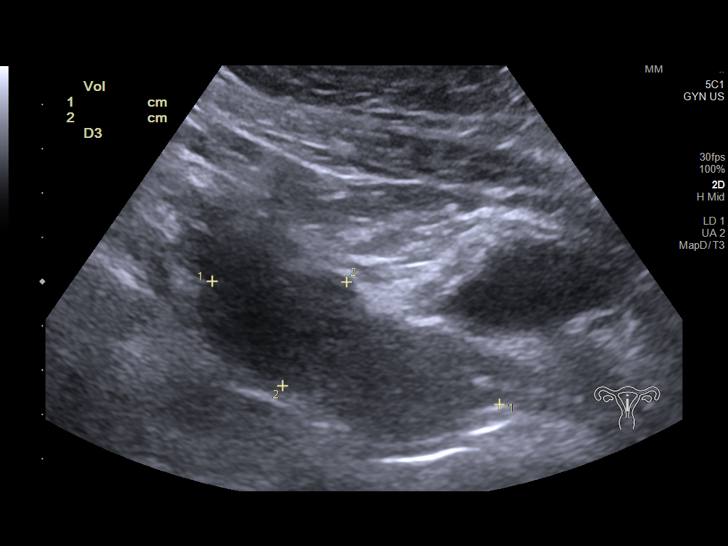
[im 5/49]
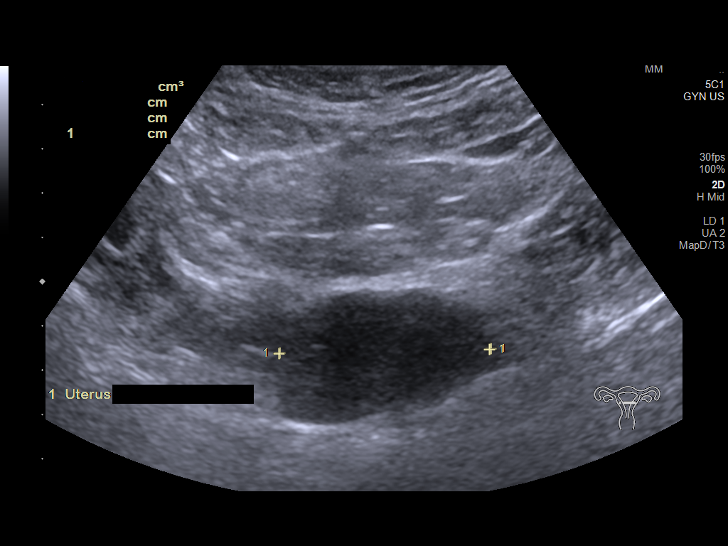
[im 9/49]
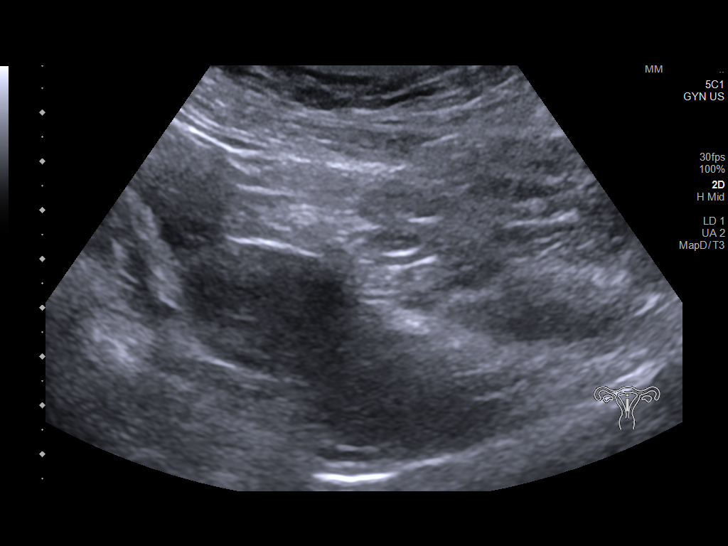
[im 13/49]
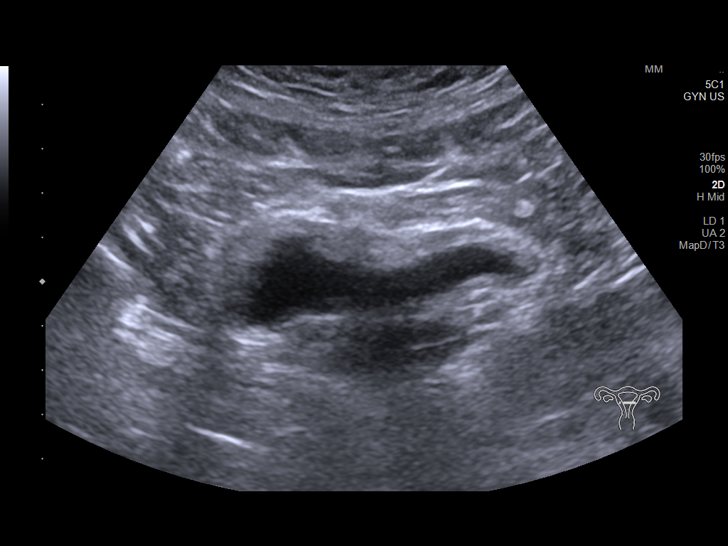
[im 17/49]
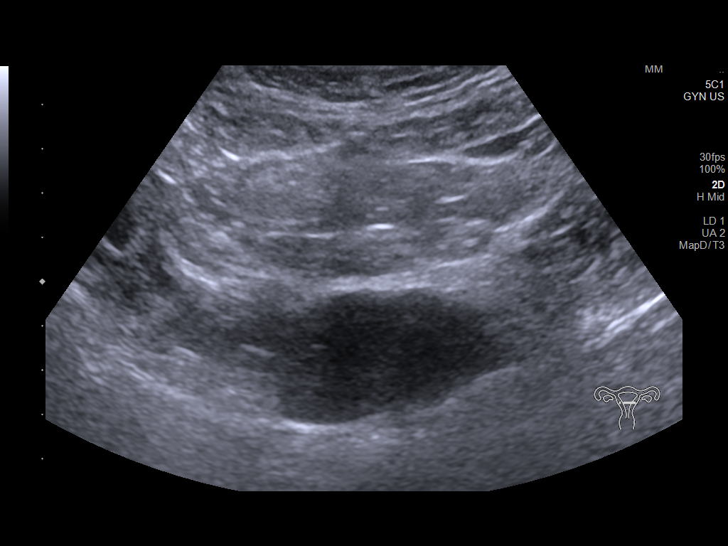
[im 19/49]
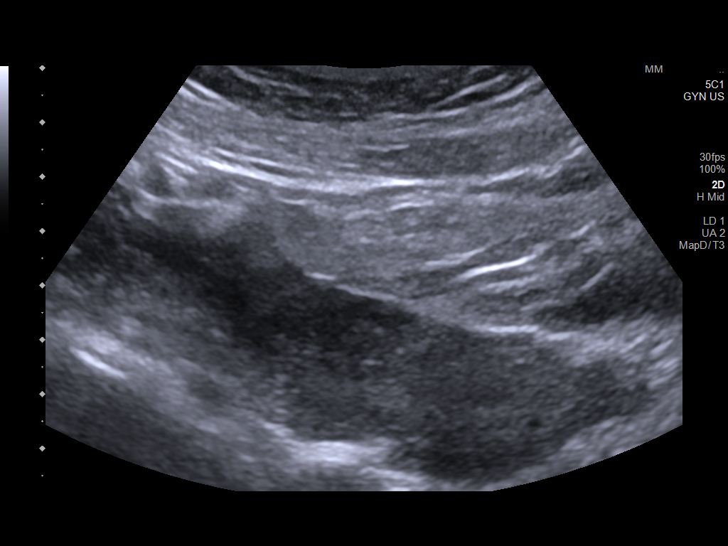
[im 23/49]
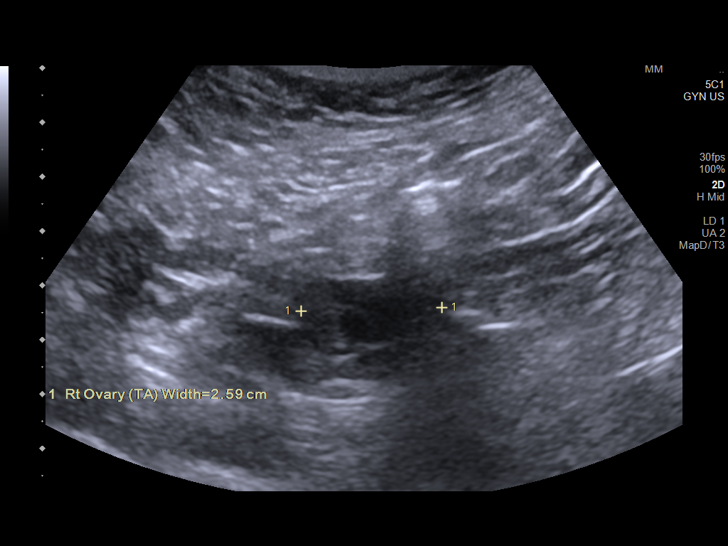
[im 27/49]
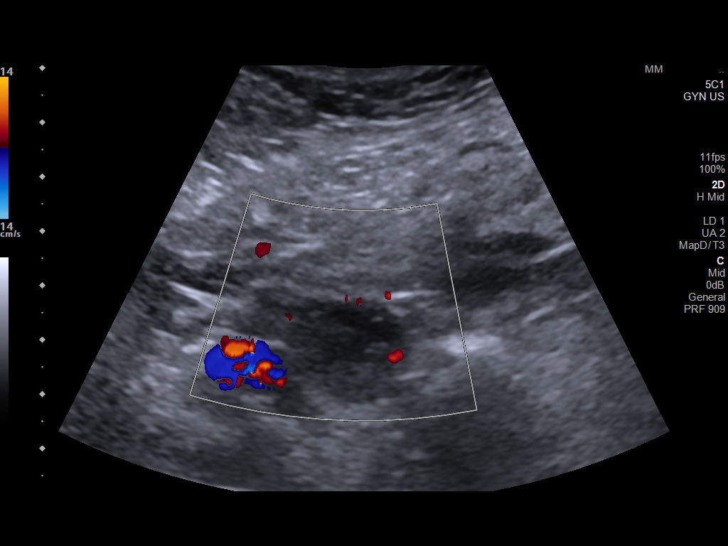
[im 31/49]
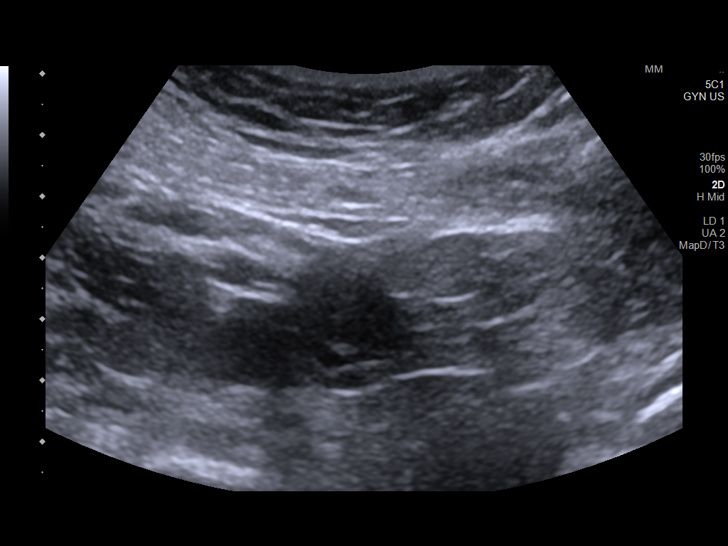
[im 33/49]
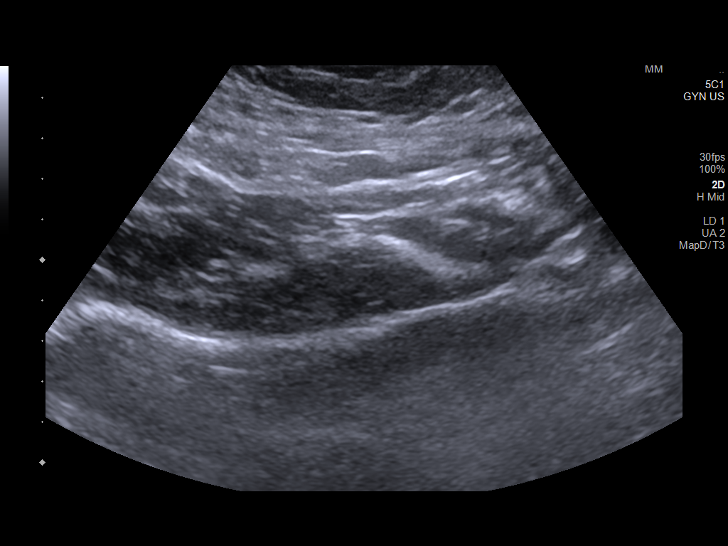
[im 37/49]
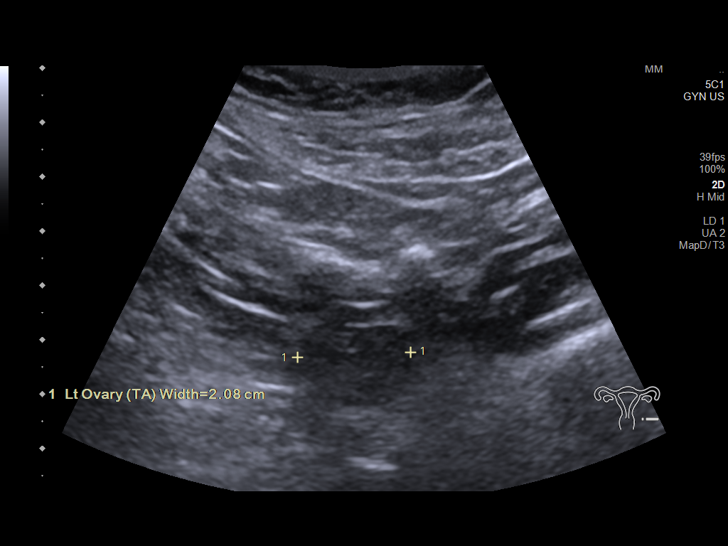
[im 41/49]
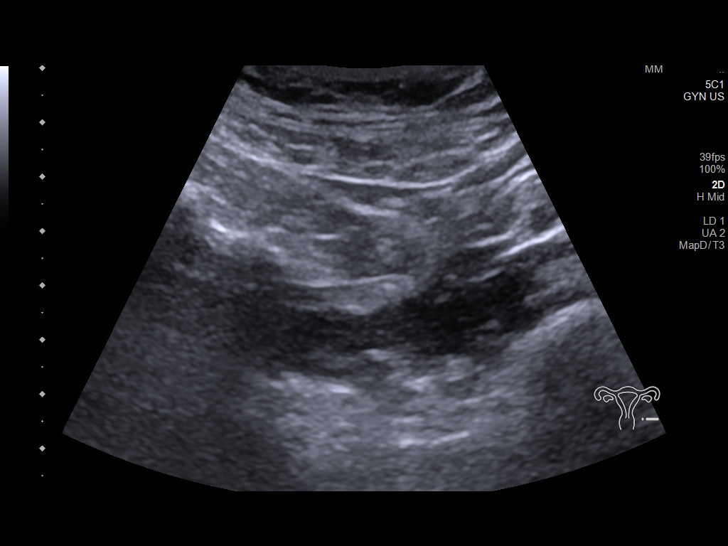
[im 45/49]
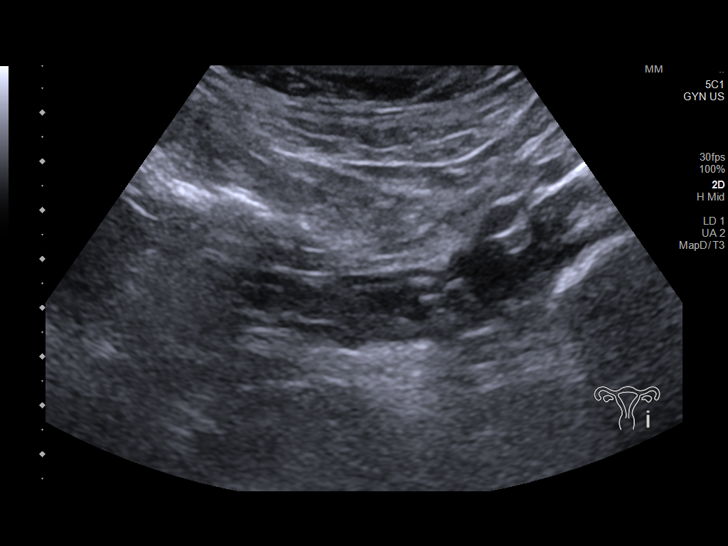
[im 49/49]
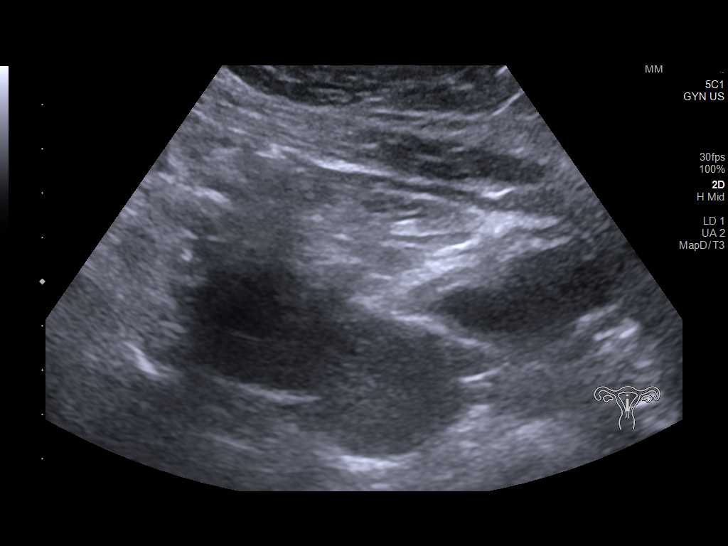

[14 of 25 positions shown; findings below may reference images not displayed]

FINDINGS: Uterus

Measurements: 7.1 x 2.8 x 4.8 centimeters = volume: 48.0 mL. No
fibroids or other mass visualized.

Endometrium

Thickness: 3.2.  No focal abnormality visualized.

Right ovary

Measurements: 3.0 x 1.9 x 2.6 centimeters = volume: 8.0 mL. Normal
appearance/no adnexal mass.

Left ovary

Measurements: 3.3 x 1.7 x 2.1 centimeters = volume: 7.0 mL. Normal
appearance/no adnexal mass.

Other findings: No abnormal free fluid. Patient declined the
endovaginal portion of the exam.
IMPRESSION: Normal pelvic ultrasound.

## 2021-12-28 ENCOUNTER — Ambulatory Visit: Payer: Medicaid Other

## 2022-01-23 ENCOUNTER — Other Ambulatory Visit (INDEPENDENT_AMBULATORY_CARE_PROVIDER_SITE_OTHER): Payer: Medicaid Other

## 2022-01-23 ENCOUNTER — Encounter: Payer: Self-pay | Admitting: Obstetrics & Gynecology

## 2022-01-23 DIAGNOSIS — R300 Dysuria: Secondary | ICD-10-CM | POA: Diagnosis not present

## 2022-01-23 DIAGNOSIS — G8929 Other chronic pain: Secondary | ICD-10-CM | POA: Diagnosis not present

## 2022-01-23 DIAGNOSIS — R1030 Lower abdominal pain, unspecified: Secondary | ICD-10-CM

## 2022-01-23 DIAGNOSIS — R102 Pelvic and perineal pain: Secondary | ICD-10-CM | POA: Diagnosis not present

## 2022-01-23 DIAGNOSIS — R109 Unspecified abdominal pain: Secondary | ICD-10-CM | POA: Diagnosis not present

## 2022-01-23 LAB — POCT URINALYSIS DIPSTICK
Blood, UA: NEGATIVE
Glucose, UA: NEGATIVE
Ketones, UA: NEGATIVE
Leukocytes, UA: NEGATIVE
Nitrite, UA: NEGATIVE
Protein, UA: NEGATIVE

## 2022-01-23 NOTE — Progress Notes (Signed)
? ?  NURSE VISIT- UTI SYMPTOMS  ? ?SUBJECTIVE:  ?Erica Colon is a 21 y.o. G26P1001 female here for UTI symptoms. She is a GYN patient. She reports dysuria and lower abdominal pain "for a while now". Had been having what felt like she was going to have a period and her birth control was changed in hopes it would help with her abdominal pain. ? ?OBJECTIVE:  ?There were no vitals taken for this visit.  ?Appears well, in no apparent distress ? ?Results for orders placed or performed in visit on 01/23/22 (from the past 24 hour(s))  ?POCT Urinalysis Dipstick  ? Collection Time: 01/23/22  3:43 PM  ?Result Value Ref Range  ? Color, UA    ? Clarity, UA    ? Glucose, UA Negative Negative  ? Bilirubin, UA    ? Ketones, UA neg   ? Spec Grav, UA    ? Blood, UA neg   ? pH, UA    ? Protein, UA Negative Negative  ? Urobilinogen, UA    ? Nitrite, UA neg   ? Leukocytes, UA Negative Negative  ? Appearance    ? Odor    ? ? ?ASSESSMENT: ?GYN patient with UTI symptoms and negative nitrites ? ?PLAN: ?Discussed with Dr. Charlotta Newton   ?Rx sent by provider today: No ?Urine culture sent per patient request ?Call or return to clinic prn if these symptoms worsen or fail to improve as anticipated. Advised to give the Nexplanon a little time and if not better early summer, to make an appointment with a provider. ?Follow-up: as needed  ? ?Erica Colon  ?01/23/2022 ?3:43 PM ? ?

## 2022-01-24 DIAGNOSIS — R109 Unspecified abdominal pain: Secondary | ICD-10-CM | POA: Diagnosis not present

## 2022-01-25 ENCOUNTER — Telehealth: Payer: Self-pay

## 2022-01-25 LAB — URINE CULTURE

## 2022-01-25 NOTE — Telephone Encounter (Signed)
Transition Care Management Unsuccessful Follow-up Telephone Call ? ?Date of discharge and from where:  01/24/2022-[facility] ? ?Attempts:  1st Attempt ? ?Reason for unsuccessful TCM follow-up call:  Left voice message ? ?  ?

## 2022-01-26 NOTE — Telephone Encounter (Signed)
Transition Care Management Unsuccessful Follow-up Telephone Call ? ?Date of discharge and from where:  01/24/2022-[facility] ? ?Attempts:  2nd Attempt ? ?Reason for unsuccessful TCM follow-up call:  Left voice message ? ? ? ?

## 2022-01-30 ENCOUNTER — Ambulatory Visit: Payer: Medicaid Other | Admitting: Obstetrics & Gynecology

## 2022-01-30 NOTE — Telephone Encounter (Signed)
Transition Care Management Follow-up Telephone Call ?Date of discharge and from where: 01/24/2022-[facility] ?How have you been since you were released from the hospital? Pt stated she is still having some pain but is seeing OB tomorrow.  ?Any questions or concerns? No ? ?Items Reviewed: ?Did the pt receive and understand the discharge instructions provided? Yes  ?Medications obtained and verified? Yes  ?Other? No  ?Any new allergies since your discharge? No  ?Dietary orders reviewed? No ?Do you have support at home? Yes  ? ?Home Care and Equipment/Supplies: ?Were home health services ordered? not applicable ?If so, what is the name of the agency? N/A   ?Has the agency set up a time to come to the patient's home? not applicable ?Were any new equipment or medical supplies ordered?  No ?What is the name of the medical supply agency? N/A ?Were you able to get the supplies/equipment? not applicable ?Do you have any questions related to the use of the equipment or supplies? No ? ?Functional Questionnaire: (I = Independent and D = Dependent) ?ADLs: I ? ?Bathing/Dressing- I ? ?Meal Prep- I ? ?Eating- I ? ?Maintaining continence- I ? ?Transferring/Ambulation- I ? ?Managing Meds- I ? ?Follow up appointments reviewed: ? ?PCP Hospital f/u appt confirmed? No   ?Specialist Hospital f/u appt confirmed? Yes  Scheduled to see OBGYN  on 01/31/2022. ?Are transportation arrangements needed? No  ?If their condition worsens, is the pt aware to call PCP or go to the Emergency Dept.? Yes ?Was the patient provided with contact information for the PCP's office or ED? Yes ?Was to pt encouraged to call back with questions or concerns? Yes  ?

## 2022-01-31 ENCOUNTER — Encounter: Payer: Self-pay | Admitting: Obstetrics & Gynecology

## 2022-01-31 ENCOUNTER — Ambulatory Visit (INDEPENDENT_AMBULATORY_CARE_PROVIDER_SITE_OTHER): Payer: Medicaid Other | Admitting: Obstetrics & Gynecology

## 2022-01-31 VITALS — BP 137/85 | HR 77 | Ht 62.0 in | Wt 188.0 lb

## 2022-01-31 DIAGNOSIS — R1030 Lower abdominal pain, unspecified: Secondary | ICD-10-CM | POA: Diagnosis not present

## 2022-01-31 MED ORDER — GABAPENTIN 300 MG PO CAPS: 300.0000 mg | ORAL_CAPSULE | Freq: Three times a day (TID) | ORAL | 1 refills | Status: DC
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
## 2022-03-07 ENCOUNTER — Ambulatory Visit: Payer: Medicaid Other | Admitting: Obstetrics & Gynecology

## 2022-03-07 DIAGNOSIS — H5213 Myopia, bilateral: Secondary | ICD-10-CM | POA: Diagnosis not present

## 2022-03-08 ENCOUNTER — Ambulatory Visit: Payer: Medicaid Other | Admitting: Obstetrics & Gynecology

## 2022-03-10 ENCOUNTER — Ambulatory Visit: Payer: Medicaid Other | Admitting: Obstetrics & Gynecology

## 2022-03-18 DIAGNOSIS — B349 Viral infection, unspecified: Secondary | ICD-10-CM | POA: Diagnosis not present

## 2022-03-18 DIAGNOSIS — R0989 Other specified symptoms and signs involving the circulatory and respiratory systems: Secondary | ICD-10-CM | POA: Diagnosis not present

## 2022-03-18 DIAGNOSIS — R7989 Other specified abnormal findings of blood chemistry: Secondary | ICD-10-CM | POA: Diagnosis not present

## 2022-03-18 DIAGNOSIS — R6510 Systemic inflammatory response syndrome (SIRS) of non-infectious origin without acute organ dysfunction: Secondary | ICD-10-CM | POA: Diagnosis not present

## 2022-03-18 DIAGNOSIS — R509 Fever, unspecified: Secondary | ICD-10-CM | POA: Diagnosis not present

## 2022-03-18 DIAGNOSIS — K429 Umbilical hernia without obstruction or gangrene: Secondary | ICD-10-CM | POA: Diagnosis not present

## 2022-03-18 DIAGNOSIS — E871 Hypo-osmolality and hyponatremia: Secondary | ICD-10-CM | POA: Diagnosis not present

## 2022-03-18 DIAGNOSIS — F32A Depression, unspecified: Secondary | ICD-10-CM | POA: Diagnosis not present

## 2022-03-18 DIAGNOSIS — Z98891 History of uterine scar from previous surgery: Secondary | ICD-10-CM | POA: Diagnosis not present

## 2022-03-18 DIAGNOSIS — R000 Tachycardia, unspecified: Secondary | ICD-10-CM | POA: Diagnosis not present

## 2022-03-18 DIAGNOSIS — G609 Hereditary and idiopathic neuropathy, unspecified: Secondary | ICD-10-CM | POA: Diagnosis not present

## 2022-03-19 DIAGNOSIS — E871 Hypo-osmolality and hyponatremia: Secondary | ICD-10-CM | POA: Diagnosis not present

## 2022-03-19 DIAGNOSIS — F32A Depression, unspecified: Secondary | ICD-10-CM | POA: Diagnosis not present

## 2022-03-19 DIAGNOSIS — G63 Polyneuropathy in diseases classified elsewhere: Secondary | ICD-10-CM | POA: Diagnosis not present

## 2022-03-19 DIAGNOSIS — R7989 Other specified abnormal findings of blood chemistry: Secondary | ICD-10-CM | POA: Diagnosis not present

## 2022-03-19 DIAGNOSIS — R6510 Systemic inflammatory response syndrome (SIRS) of non-infectious origin without acute organ dysfunction: Secondary | ICD-10-CM | POA: Diagnosis not present

## 2022-03-19 DIAGNOSIS — R000 Tachycardia, unspecified: Secondary | ICD-10-CM | POA: Diagnosis not present

## 2022-03-20 ENCOUNTER — Ambulatory Visit: Payer: Medicaid Other | Admitting: Obstetrics & Gynecology

## 2022-03-20 DIAGNOSIS — E871 Hypo-osmolality and hyponatremia: Secondary | ICD-10-CM | POA: Diagnosis not present

## 2022-03-20 DIAGNOSIS — F32A Depression, unspecified: Secondary | ICD-10-CM | POA: Diagnosis not present

## 2022-03-20 DIAGNOSIS — G63 Polyneuropathy in diseases classified elsewhere: Secondary | ICD-10-CM | POA: Diagnosis not present

## 2022-03-20 DIAGNOSIS — R6510 Systemic inflammatory response syndrome (SIRS) of non-infectious origin without acute organ dysfunction: Secondary | ICD-10-CM | POA: Diagnosis not present

## 2022-03-21 DIAGNOSIS — R000 Tachycardia, unspecified: Secondary | ICD-10-CM | POA: Diagnosis not present

## 2022-03-21 DIAGNOSIS — E871 Hypo-osmolality and hyponatremia: Secondary | ICD-10-CM | POA: Diagnosis not present

## 2022-03-21 DIAGNOSIS — G63 Polyneuropathy in diseases classified elsewhere: Secondary | ICD-10-CM | POA: Diagnosis not present

## 2022-03-21 DIAGNOSIS — F32A Depression, unspecified: Secondary | ICD-10-CM | POA: Diagnosis not present

## 2022-03-21 DIAGNOSIS — R6510 Systemic inflammatory response syndrome (SIRS) of non-infectious origin without acute organ dysfunction: Secondary | ICD-10-CM | POA: Diagnosis not present

## 2022-03-22 ENCOUNTER — Telehealth: Payer: Self-pay

## 2022-03-22 NOTE — Telephone Encounter (Signed)
Transition Care Management Follow-up Telephone Call Date of discharge and from where: 03/21/2022 from [place] How have you been since you were released from the hospital? Patient stated that she is feeling some better and did not have any questions or concerns at this time.  Any questions or concerns? No  Items Reviewed: Did the pt receive and understand the discharge instructions provided? Yes  Medications obtained and verified? Yes  Other? No  Any new allergies since your discharge? No  Dietary orders reviewed? No Do you have support at home? Yes   Functional Questionnaire: (I = Independent and D = Dependent) ADLs: I  Bathing/Dressing- I  Meal Prep- I  Eating- I  Maintaining continence- I  Transferring/Ambulation- I  Managing Meds- I   Follow up appointments reviewed:  PCP Hospital f/u appt confirmed? No   Specialist Hospital f/u appt confirmed? Yes  Scheduled to see [facility] on 03/27/2022 @ 8:30am. Are transportation arrangements needed? No  If their condition worsens, is the pt aware to call PCP or go to the Emergency Dept.? Yes Was the patient provided with contact information for the PCP's office or ED? Yes Was to pt encouraged to call back with questions or concerns? Yes

## 2022-03-27 ENCOUNTER — Encounter: Payer: Self-pay | Admitting: Adult Health

## 2022-03-27 ENCOUNTER — Ambulatory Visit (INDEPENDENT_AMBULATORY_CARE_PROVIDER_SITE_OTHER): Payer: Medicaid Other | Admitting: Adult Health

## 2022-03-27 ENCOUNTER — Other Ambulatory Visit (HOSPITAL_COMMUNITY)
Admission: RE | Admit: 2022-03-27 | Discharge: 2022-03-27 | Disposition: A | Discharge status: Home / Self Care | Payer: Medicaid Other | Source: Ambulatory Visit | Attending: Adult Health | Admitting: Adult Health

## 2022-03-27 VITALS — BP 128/88 | HR 92 | Ht 62.0 in | Wt 196.0 lb

## 2022-03-27 DIAGNOSIS — R319 Hematuria, unspecified: Secondary | ICD-10-CM

## 2022-03-27 DIAGNOSIS — N941 Unspecified dyspareunia: Secondary | ICD-10-CM

## 2022-03-27 DIAGNOSIS — Z975 Presence of (intrauterine) contraceptive device: Secondary | ICD-10-CM | POA: Diagnosis not present

## 2022-03-27 DIAGNOSIS — Z0000 Encounter for general adult medical examination without abnormal findings: Secondary | ICD-10-CM | POA: Diagnosis not present

## 2022-03-27 DIAGNOSIS — Z01419 Encounter for gynecological examination (general) (routine) without abnormal findings: Secondary | ICD-10-CM | POA: Insufficient documentation

## 2022-03-27 DIAGNOSIS — R309 Painful micturition, unspecified: Secondary | ICD-10-CM | POA: Insufficient documentation

## 2022-03-27 LAB — POCT URINALYSIS DIPSTICK OB
Ketones, UA: NEGATIVE
Leukocytes, UA: NEGATIVE
Nitrite, UA: NEGATIVE

## 2022-03-27 MED ORDER — SULFAMETHOXAZOLE-TRIMETHOPRIM 800-160 MG PO TABS: 1.0000 | ORAL_TABLET | Freq: Two times a day (BID) | ORAL | 0 refills | Status: DC

## 2022-03-27 NOTE — Addendum Note (Signed)
Addended by: Annamarie Dawley on: 03/27/2022 09:20 AM   Modules accepted: Orders

## 2022-03-27 NOTE — Progress Notes (Signed)
Patient ID: Erica Colon, female   DOB: 07/13/2001, 20 y.o.   MRN: [id number] History of Present Illness: Erica Colon is a 21 year old white female,single, G1P1 in for a well woman gyn exam and first pap, she will ge 21 in 3 days. She complains of pain with urination and pain with sex, the pain with sex has been since had baby 3 years ago. No PCP yet, but hopes to see Kathlen Brunswick in [place].    Current Medications, Allergies, Past Medical History, Past Surgical History, Family History and Social History were reviewed in [employer] record.     Review of Systems: Patient denies any headaches, hearing loss, fatigue, blurred vision, shortness of breath, chest pain, abdominal pain, problems with bowel movements.  No joint pain or mood swings.  See HPI for positives.   Physical Exam:BP 128/88 (BP Location: Right Arm, Patient Position: Sitting, Cuff Size: Normal)   Pulse 92   Ht 5\' 2"  (1.575 m)   Wt 196 lb (88.9 kg)   BMI 35.85 kg/m  urine dipstick +glucose,blood and protein, but not enough to culture.  General:  Well developed, well nourished, no acute distress Skin:  Warm and dry Neck:  Midline trachea, normal thyroid, good ROM, no lymphadenopathy Lungs; Clear to auscultation bilaterally Breast:  No dominant palpable mass, retraction, or nipple discharge, has some tenderness Cardiovascular: Regular rate and rhythm Abdomen:  Soft, non tender, no hepatosplenomegaly Pelvic:  External genitalia is normal in appearance, no lesions.  The vagina is normal in appearance. Urethra has no lesions or masses. The cervix is bulbous. Pap with GC/CHL performed, she was uncomfortable with exam but tolerated well.  Uterus is felt to be normal size, shape, and contour.  No adnexal masses or tenderness noted.Bladder is non tender, no masses felt. Extremities/musculoskeletal:  No swelling or varicosities noted, no clubbing or cyanosis Psych:  No mood changes, alert and cooperative,seems  happy AA is 1 Fall risk is low    03/27/2022    8:37 AM 12/21/2021   10:49 AM 11/17/2021   10:18 AM  Depression screen PHQ 2/9  Decreased Interest 1 0 1  Down, Depressed, Hopeless 0 0 0  PHQ - 2 Score 1 0 1  Altered sleeping 2 2 2   Tired, decreased energy 2 2 2   Change in appetite 2 2 2   Feeling bad or failure about yourself  0 0 1  Trouble concentrating 0 0 2  Moving slowly or fidgety/restless 0 0 1  Suicidal thoughts 0 0 0  PHQ-9 Score 7 6 11    On zoloft    03/27/2022    8:37 AM 12/21/2021   10:51 AM 11/17/2021   10:21 AM 10/05/2021    3:09 PM  GAD 7 : Generalized Anxiety Score  Nervous, Anxious, on Edge 1 2 1 2   Control/stop worrying 1 1 1 2   Worry too much - different things 0 1 1 2   Trouble relaxing 2 1 1 2   Restless 1 1 0 2  Easily annoyed or irritable 1 1 1 2   Afraid - awful might happen 0 0 0 2  Total GAD 7 Score 6 7 5 14   Anxiety Difficulty    Very difficult    Upstream - 03/27/22 0837       Pregnancy Intention Screening   Does the patient want to become pregnant in the next year? Unsure    Does the patient's partner want to become pregnant in the next year? Unsure  Would the patient like to discuss contraceptive options today? No      Contraception Wrap Up   Current Method Hormonal Implant    End Method Hormonal Implant    Contraception Counseling Provided No              Examination chaperoned by Faith Rogue LPN   Impression and Plan: 1. Encounter for gynecological examination with Papanicolaou smear of cervix Pap sent Physical in 1 year Pap in 3 if normal  Has appt with Dr Despina Hidden in follow up on taking neurontin this week, it is not helping she says   2. Pain with urination Will rx septra ds Meds ordered this encounter  Medications   sulfamethoxazole-trimethoprim (BACTRIM DS) 800-160 MG tablet    Sig: Take 1 tablet by mouth 2 (two) times daily. Take 1 bid    Dispense:  14 tablet    Refill:  0    Order Specific Question:   Supervising  Provider    Answer:   Despina Hidden, LUTHER H [[id number]]     3. Dyspareunia in female Has had pain since delivery 3 years ago Use good lubricate Increase foreplay Follow up prn   4. Nexplanon in place Paced 12/21/21  5. Hematuria, unspecified type Not enough urine for culture  Will rx septra ds

## 2022-03-28 ENCOUNTER — Telehealth: Payer: Medicaid Other | Admitting: Obstetrics & Gynecology

## 2022-03-28 ENCOUNTER — Telehealth: Payer: Medicaid Other | Admitting: Women's Health

## 2022-03-28 ENCOUNTER — Encounter: Payer: Self-pay | Admitting: *Deleted

## 2022-03-28 LAB — CYTOLOGY - PAP
Adequacy: ABSENT
Chlamydia: NEGATIVE
Comment: NEGATIVE
Comment: NORMAL
Diagnosis: NEGATIVE
Neisseria Gonorrhea: NEGATIVE

## 2022-03-30 ENCOUNTER — Telehealth: Payer: Medicaid Other | Admitting: Obstetrics & Gynecology

## 2022-06-28 ENCOUNTER — Encounter (HOSPITAL_COMMUNITY): Payer: Self-pay | Admitting: *Deleted

## 2022-06-28 ENCOUNTER — Other Ambulatory Visit: Payer: Self-pay

## 2022-06-28 ENCOUNTER — Emergency Department (HOSPITAL_COMMUNITY)
Admission: EM | Admit: 2022-06-28 | Discharge: 2022-06-29 | Disposition: A | Discharge status: Home / Self Care | Payer: Medicaid Other | Attending: Emergency Medicine | Admitting: Emergency Medicine

## 2022-06-28 DIAGNOSIS — M5459 Other low back pain: Secondary | ICD-10-CM | POA: Diagnosis not present

## 2022-06-28 DIAGNOSIS — G8929 Other chronic pain: Secondary | ICD-10-CM | POA: Diagnosis not present

## 2022-06-28 DIAGNOSIS — M545 Low back pain, unspecified: Secondary | ICD-10-CM | POA: Diagnosis not present

## 2022-06-28 DIAGNOSIS — R102 Pelvic and perineal pain unspecified side: Secondary | ICD-10-CM

## 2022-06-28 LAB — URINALYSIS, ROUTINE W REFLEX MICROSCOPIC
Bilirubin Urine: NEGATIVE
Glucose, UA: NEGATIVE mg/dL
Ketones, ur: NEGATIVE mg/dL
Leukocytes,Ua: NEGATIVE
Nitrite: NEGATIVE
Protein, ur: NEGATIVE mg/dL
Specific Gravity, Urine: 1.014 (ref 1.005–1.030)
pH: 6 (ref 5.0–8.0)

## 2022-06-28 LAB — PREGNANCY, URINE: Preg Test, Ur: NEGATIVE

## 2022-06-28 NOTE — ED Triage Notes (Signed)
Pt with pelvic and abd pain since Monday. Denies burning with urination or vaginal bleeding or discharge. Denies any N/V/D or fevers.

## 2022-06-29 ENCOUNTER — Emergency Department (HOSPITAL_BASED_OUTPATIENT_CLINIC_OR_DEPARTMENT_OTHER): Payer: Medicaid Other

## 2022-06-29 ENCOUNTER — Encounter (HOSPITAL_BASED_OUTPATIENT_CLINIC_OR_DEPARTMENT_OTHER): Payer: Self-pay

## 2022-06-29 ENCOUNTER — Other Ambulatory Visit: Payer: Self-pay

## 2022-06-29 ENCOUNTER — Emergency Department (HOSPITAL_BASED_OUTPATIENT_CLINIC_OR_DEPARTMENT_OTHER)
Admission: EM | Admit: 2022-06-29 | Discharge: 2022-06-29 | Disposition: A | Discharge status: Home / Self Care | Payer: Medicaid Other | Attending: Emergency Medicine | Admitting: Emergency Medicine

## 2022-06-29 DIAGNOSIS — R102 Pelvic and perineal pain: Secondary | ICD-10-CM | POA: Diagnosis not present

## 2022-06-29 DIAGNOSIS — M545 Low back pain, unspecified: Secondary | ICD-10-CM | POA: Diagnosis not present

## 2022-06-29 DIAGNOSIS — R109 Unspecified abdominal pain: Secondary | ICD-10-CM | POA: Diagnosis not present

## 2022-06-29 LAB — CBC WITH DIFFERENTIAL/PLATELET
Abs Immature Granulocytes: 0.03 10*3/uL (ref 0.00–0.07)
Basophils Absolute: 0.1 10*3/uL (ref 0.0–0.1)
Basophils Relative: 1 %
Eosinophils Absolute: 0 10*3/uL (ref 0.0–0.5)
Eosinophils Relative: 1 %
HCT: 47.6 % — ABNORMAL HIGH (ref 36.0–46.0)
Hemoglobin: 15.7 g/dL — ABNORMAL HIGH (ref 12.0–15.0)
Immature Granulocytes: 1 %
Lymphocytes Relative: 36 %
Lymphs Abs: 2.2 10*3/uL (ref 0.7–4.0)
MCH: 27.6 pg (ref 26.0–34.0)
MCHC: 33 g/dL (ref 30.0–36.0)
MCV: 83.8 fL (ref 80.0–100.0)
Monocytes Absolute: 0.9 10*3/uL (ref 0.1–1.0)
Monocytes Relative: 15 %
Neutro Abs: 2.9 10*3/uL (ref 1.7–7.7)
Neutrophils Relative %: 46 %
Platelets: 267 10*3/uL (ref 150–400)
RBC: 5.68 MIL/uL — ABNORMAL HIGH (ref 3.87–5.11)
RDW: 13.8 % (ref 11.5–15.5)
WBC: 6.1 10*3/uL (ref 4.0–10.5)
nRBC: 0 % (ref 0.0–0.2)

## 2022-06-29 LAB — URINALYSIS, ROUTINE W REFLEX MICROSCOPIC
Bilirubin Urine: NEGATIVE
Glucose, UA: NEGATIVE mg/dL
Hgb urine dipstick: NEGATIVE
Ketones, ur: NEGATIVE mg/dL
Leukocytes,Ua: NEGATIVE
Nitrite: NEGATIVE
Specific Gravity, Urine: 1.02 (ref 1.005–1.030)
pH: 6 (ref 5.0–8.0)

## 2022-06-29 LAB — BASIC METABOLIC PANEL
Anion gap: 11 (ref 5–15)
BUN: 17 mg/dL (ref 6–20)
CO2: 25 mmol/L (ref 22–32)
Calcium: 10.2 mg/dL (ref 8.9–10.3)
Chloride: 103 mmol/L (ref 98–111)
Creatinine, Ser: 0.86 mg/dL (ref 0.44–1.00)
GFR, Estimated: >60 mL/min (ref >60)
Glucose, Bld: 87 mg/dL (ref 70–99)
Potassium: 3.7 mmol/L (ref 3.5–5.1)
Sodium: 139 mmol/L (ref 135–145)

## 2022-06-29 LAB — PREGNANCY, URINE: Preg Test, Ur: NEGATIVE

## 2022-06-29 MED ORDER — NAPROXEN 500 MG PO TABS: 500.0000 mg | ORAL_TABLET | Freq: Two times a day (BID) | ORAL | 0 refills | Status: DC

## 2022-06-29 MED ORDER — METHOCARBAMOL 500 MG PO TABS: 500.0000 mg | ORAL_TABLET | Freq: Two times a day (BID) | ORAL | 0 refills | Status: DC

## 2022-06-29 MED ORDER — KETOROLAC TROMETHAMINE 30 MG/ML IJ SOLN
30.0000 mg | Freq: Once | INTRAMUSCULAR | Status: AC
  Administered 2022-06-29: 30 mg via INTRAMUSCULAR
  Filled 2022-06-29: qty 1

## 2022-06-29 MED ORDER — IOHEXOL 300 MG/ML  SOLN
80.0000 mL | Freq: Once | INTRAMUSCULAR | Status: AC | PRN
  Administered 2022-06-29: 80 mL via INTRAVENOUS

## 2022-06-29 MED ORDER — ACETAMINOPHEN 500 MG PO TABS: 1000.0000 mg | ORAL_TABLET | Freq: Four times a day (QID) | ORAL | Status: DC | PRN

## 2022-06-29 NOTE — Discharge Instructions (Signed)
Your testing is reassuring.  No evidence of ovarian torsion or kidney stone or appendicitis or cholecystitis. Take the locations you are prescribed last night and follow-up with the gynecologist.  Return to the ED with worsening pain, fever, vomiting, other concerns

## 2022-06-29 NOTE — ED Provider Notes (Signed)
Care assumed from Dr. Mayra Neer.  Patient pending pelvic ultrasound for evaluation of low back pain and ongoing pelvic pain.  Ultrasound negative for ovarian torsion or other acute findings  CT also shows no appendicitis.  No acute pathology seen.  Labs are reassuring.  Urinalysis is negative.  Patient stable for outpatient follow-up with her gynecologist and PCP.  Return precautions discussed   Ezequiel Essex, MD 06/29/22 [phone]

## 2022-06-29 NOTE — ED Provider Notes (Signed)
[facility] EMERGENCY DEPT Provider Note   CSN: [id number] Arrival date & time: 06/29/22  1214     History {Add pertinent medical, surgical, social history, OB history to HPI:1} Chief Complaint  Patient presents with   Pelvic Pain    Erica Colon is a 21 y.o. female with history of pelvic pain/dyspareunia, anxiety depression, postpartum hypertension presents with pelvic pain.  Presents with right-sided pelvic pain and middle/right lower back pain since Monday.  Rated 10/10, constant stabbing. States she has a history of similar pain but not this severe.  Denies fever/chills, nausea vomiting diarrhea constipation, dysuria/hematuria, vaginal bleeding discharge itching or burning.  Had a C-section 3 years ago, no other abdominal surgery. No allergies. Has had no back trauma, no falls. Was not moving or lifting when it started. No radiation down legs. No saddle anesthesia, numbness/tingling, urinary/bowel incontinence/retention.  Per chart review, patient was seen last night at [facility] emergency department.  Mention at that time that her back pain is worse with movement and her pelvic pain has been ongoing for 3+ years.    Pelvic Pain       Home Medications Prior to Admission medications   Medication Sig Start Date End Date Taking? Authorizing Provider  etonogestrel (NEXPLANON) 68 MG IMPL implant 1 each by Subdermal route once.    [provider]  gabapentin (NEURONTIN) 300 MG capsule Take 1 capsule (300 mg total) by mouth 3 (three) times daily. 01/31/22   Florian Buff, MD  methocarbamol (ROBAXIN) 500 MG tablet Take 1 tablet (500 mg total) by mouth 2 (two) times daily. 06/29/22   Truddie Hidden, MD  naproxen (NAPROSYN) 500 MG tablet Take 1 tablet (500 mg total) by mouth 2 (two) times daily. 06/29/22   Truddie Hidden, MD  sertraline (ZOLOFT) 50 MG tablet TAKE 1 TABLET BY MOUTH EVERY DAY 10/28/21   Estill Dooms, NP      Allergies    Patient  has no known allergies.    Review of Systems   Review of Systems  Genitourinary:  Positive for pelvic pain.   Review of systems negative for f/c, N/V.  A 10 point review of systems was performed and is negative unless otherwise reported in HPI.  Physical Exam Updated Vital Signs BP 120/63   Pulse 81   Temp 98.3 F (36.8 C) (Oral)   Resp 16   SpO2 99%  Physical Exam General: Normal appearing female, lying in bed.  HEENT: PERRLA, Sclera anicteric, MMM, trachea midline. Cardiology: RRR, no murmurs/rubs/gallops. BL radial and DP pulses equal bilaterally.  Resp: Normal respiratory rate and effort. CTAB, no wheezes, rhonchi, crackles.  Abd: Soft, non-distended. No rebound tenderness or guarding.  GU: Deferred. MSK: No peripheral edema or signs of trauma. Extremities without deformity or TTP. No cyanosis or clubbing. Skin: warm, dry. No rashes or lesions. Back: No CVA tenderness Neuro: A&Ox4, CNs II-XII grossly intact. MAEs. Sensation grossly intact.  Psych: Normal mood and affect.   ED Results / Procedures / Treatments   Labs (all labs ordered are listed, but only abnormal results are displayed) Labs Reviewed  URINALYSIS, ROUTINE W REFLEX MICROSCOPIC - Abnormal; Notable for the following components:      Result Value   APPearance HAZY (*)    Protein, ur TRACE (*)    All other components within normal limits  PREGNANCY, URINE   Radiology No results found.  Procedures Procedures  {Document cardiac monitor, telemetry assessment procedure when appropriate:1}  Medications Ordered in  ED Medications  acetaminophen (TYLENOL) tablet 1,000 mg (has no administration in time range)    ED Course/ Medical Decision Making/ A&P                          Medical Decision Making Amount and/or Complexity of Data Reviewed Labs: ordered. Radiology: ordered.  Risk Prescription drug management.   Patient  is overall well-appearing and vitally stable. Abdominal exam without  peritoneal signs or evidence of acute abdomen.   For DDX for pelvic  pain includes but is not limited to:  Low suspicion for acute hepatobiliary disease (including acute cholecystitis or cholangitis), acute pancreatitis (neg lipase), PUD (including gastric perforation), acute infectious processes (pneumonia, hepatitis, pyelonephritis), acute appendicitis, vascular catastrophe, bowel obstruction, viscus perforation, or testicular/ovarian*** torsion, diverticulitis.    DDX for low back pain includes but is not limited to: ***  Consider MSK pain as presenting etiology and back strain or sciatica. Less likely sciatica as straight leg raise test was negative. No back pain red flags on history or physical. Presentation not consistent with malignancy (lack of history of malignancy, lack of B symptoms), fracture (no trauma, no bony tenderness to palpation), cauda equina (no bowel or urinary incontinence/retention, no saddle anesthesia, no distal weakness), AAA, viscus perforation, osteomyelitis or epidural abscess (no IVDU, vertebral tenderness), renal colic, pyelonephritis (afebrile, no CVAT, no urinary symptoms). Given the clinical picture, no indication for imaging at this time.   I have personally reviewed and interpreted all labs and imaging.    Labs today demonstrate negative pregnancy test, negative UA. Patient is signed out to the oncoming ED physician who is made aware of her history, presentation, exam, workup, and plan. Plan is to obtain US pelvic and reevaluate.       {Document critical care time when appropriate:1} {Document review of labs and clinical decision tools ie heart score, Chads2Vasc2 etc:1}  {Document your independent review of radiology images, and any outside records:1} {Document your discussion with family members, caretakers, and with consultants:1} {Document social determinants of health affecting pt's care:1} {Document your decision making why or why not admission,  treatments were needed:1} Final Clinical Impression(s) / ED Diagnoses Final diagnoses:  None    Rx / DC Orders ED Discharge Orders     None        This note was created using dictation software, which may contain spelling or grammatical errors.

## 2022-06-29 NOTE — ED Provider Notes (Signed)
[facility] EMERGENCY DEPARTMENT  Provider Note  CSN: [id number] Arrival date & time: 06/28/22 2154  History Chief Complaint  Patient presents with   Abdominal Pain    Erica Colon is a 21 y.o. female here for R lower back pain and pelvic pain, initially stated it had been off and on for about 3-4 days with no provoking or relieving factors but on further questioning her back pain is worse with movement and her pelvic pain has been ongoing for 3+ years. She has no fever, N/V/D dysuria, vaginal bleeding or discharge.    Home Medications Prior to Admission medications   Medication Sig Start Date End Date Taking? Authorizing Provider  methocarbamol (ROBAXIN) 500 MG tablet Take 1 tablet (500 mg total) by mouth 2 (two) times daily. 06/29/22  Yes Pollyann Savoy, MD  naproxen (NAPROSYN) 500 MG tablet Take 1 tablet (500 mg total) by mouth 2 (two) times daily. 06/29/22  Yes Pollyann Savoy, MD  etonogestrel (NEXPLANON) 68 MG IMPL implant 1 each by Subdermal route once.    [provider]  gabapentin (NEURONTIN) 300 MG capsule Take 1 capsule (300 mg total) by mouth 3 (three) times daily. 01/31/22   Lazaro Arms, MD  sertraline (ZOLOFT) 50 MG tablet TAKE 1 TABLET BY MOUTH EVERY DAY 10/28/21   Adline Potter, NP     Allergies    Patient has no known allergies.   Review of Systems   Review of Systems Please see HPI for pertinent positives and negatives  Physical Exam BP (!) 126/94   Pulse (!) 104   Temp 98.5 F (36.9 C)   Resp 18   Wt 92.5 kg   SpO2 99%   BMI 37.31 kg/m   Physical Exam Vitals and nursing note reviewed.  Constitutional:      Appearance: Normal appearance.  HENT:     Head: Normocephalic and atraumatic.     Nose: Nose normal.     Mouth/Throat:     Mouth: Mucous membranes are moist.  Eyes:     Extraocular Movements: Extraocular movements intact.     Conjunctiva/sclera: Conjunctivae normal.  Cardiovascular:     Rate and Rhythm: Normal  rate.  Pulmonary:     Effort: Pulmonary effort is normal.     Breath sounds: Normal breath sounds.  Abdominal:     General: Abdomen is flat.     Palpations: Abdomen is soft.     Tenderness: There is abdominal tenderness in the left lower quadrant. There is no guarding. Negative signs include Murphy's sign and McBurney's sign.  Musculoskeletal:        General: Tenderness (R lumbar paraspinal muscles) present. No swelling. Normal range of motion.     Cervical back: Neck supple.  Skin:    General: Skin is warm and dry.  Neurological:     General: No focal deficit present.     Mental Status: She is alert.  Psychiatric:        Mood and Affect: Mood normal.     ED Results / Procedures / Treatments   EKG None  Procedures Procedures  Medications Ordered in the ED Medications  ketorolac (TORADOL) 30 MG/ML injection 30 mg (has no administration in time range)    Initial Impression and Plan  Patient is really here for two separate, unrelated complaints. After review of EMR shows multiple prior visits for pelvic pain, she admits this is a chronic problem for her since her C-section several years ago. UA is  normal, HCG neg. No further ED workup needed, recommend follow up with her Gyn.   She also has R lower back pain, clearly MSK, again UA is neg for infection. Plan toradol here, Rx for Naprosyn and Robaxin and PCP follow up.   ED Course       MDM Rules/Calculators/A&P Medical Decision Making Problems Addressed: Acute right-sided low back pain without sciatica: acute illness or injury Chronic pelvic pain in female: chronic illness or injury  Amount and/or Complexity of Data Reviewed Labs: ordered. Decision-making details documented in ED Course.  Risk Prescription drug management.    Final Clinical Impression(s) / ED Diagnoses Final diagnoses:  Acute right-sided low back pain without sciatica  Chronic pelvic pain in female    Rx / DC Orders ED Discharge Orders           Ordered    naproxen (NAPROSYN) 500 MG tablet  2 times daily        06/29/22 0046    methocarbamol (ROBAXIN) 500 MG tablet  2 times daily        06/29/22 0046             Truddie Hidden, MD 06/29/22 [phone]

## 2022-06-29 NOTE — ED Triage Notes (Signed)
Pt c/o pelvic pain and lower back pain since Monday. Pt states that she was seen at [facility] last night, given shot, which did not help. Pt states that the muscle relaxers she was given did not help.

## 2022-07-04 ENCOUNTER — Ambulatory Visit: Payer: Medicaid Other | Admitting: Adult Health

## 2022-07-04 ENCOUNTER — Encounter: Payer: Self-pay | Admitting: Adult Health

## 2022-07-04 VITALS — BP 129/84 | HR 83 | Ht 62.0 in | Wt 206.5 lb

## 2022-07-04 DIAGNOSIS — Z975 Presence of (intrauterine) contraceptive device: Secondary | ICD-10-CM | POA: Diagnosis not present

## 2022-07-04 DIAGNOSIS — N730 Acute parametritis and pelvic cellulitis: Secondary | ICD-10-CM

## 2022-07-04 DIAGNOSIS — R102 Pelvic and perineal pain: Secondary | ICD-10-CM | POA: Diagnosis not present

## 2022-07-04 DIAGNOSIS — Z113 Encounter for screening for infections with a predominantly sexual mode of transmission: Secondary | ICD-10-CM | POA: Diagnosis not present

## 2022-07-04 HISTORY — DX: Acute parametritis and pelvic cellulitis: N73.0

## 2022-07-04 MED ORDER — METRONIDAZOLE 500 MG PO TABS: ORAL_TABLET | ORAL | 0 refills | Status: DC

## 2022-07-04 MED ORDER — DOXYCYCLINE HYCLATE 100 MG PO TABS: ORAL_TABLET | ORAL | 0 refills | Status: DC

## 2022-07-04 MED ORDER — CEFTRIAXONE SODIUM 500 MG IJ SOLR
500.0000 mg | Freq: Once | INTRAMUSCULAR | Status: AC
  Administered 2022-07-04: 500 mg via INTRAMUSCULAR

## 2022-07-04 NOTE — Progress Notes (Signed)
Subjective:     Patient ID: Erica Colon, female   DOB: [birth year], 21 y.o.   MRN: [id number]  HPI Erica Colon is a 21 year old white female, married, G1P1, in follow up on being seen in ER 06/29/22 for pelvic pain and pain in back. Pelvic pain has gotten worse, but  has had for 3 years since delivery. Has pain with sex too.  Last pap was 03/27/22 negative  Review of Systems Pelvic pain for over a week that is bad, but has had some degree for 3 years since delivery  Pain with sex, hurts to the left Had some low back pain too, given robaxin and naproxen has not helped  No nausea, vomiting, diarrhea,fever or burning when pees   Reviewed past medical,surgical, social and family history. Reviewed medications and allergies.     Objective:   Physical Exam BP 129/84 (BP Location: Left Arm, Patient Position: Sitting, Cuff Size: Normal)   Pulse 83   Ht 5\' 2"  (1.575 m)   Wt 206 lb 8 oz (93.7 kg)   LMP  (LMP Unknown)   BMI 37.77 kg/m     Skin warm and dry.Pelvic: external genitalia is normal in appearance no lesions, vagina:scant discharge without odor,urethra has no lesions or masses noted, cervix:smooth and bulbous,+CMT uterus: normal size, shape and contour, positive tenderness, no masses felt, adnexa: no masses, positive tenderness noted. Bladder is non tender and no masses felt.  : Both transabdominal and transvaginal ultrasound examinations of the pelvis were performed. Transabdominal technique was performed for global imaging of the pelvis including uterus, ovaries, adnexal regions, and pelvic cul-de-sac.   It was necessary to proceed with endovaginal exam following the transabdominal exam to visualize the ovaries. Color and duplex Doppler ultrasound was utilized to evaluate blood flow to the ovaries.   COMPARISON:  07/02/2020  FINDINGS: Uterus   Measurements: 6.2 x 2.5 x 3.9 cm = volume: 32 mL. No fibroids or other mass visualized. Small amount of fluid in the  endocervical canal.   Endometrium   Thickness: 1.3 mm.  No focal abnormality visualized.   Right ovary   Measurements: 2.6 x 2.0 x 1.9 cm = volume: 5 mL. Normal appearance/no adnexal mass.   Left ovary   Measurements: 2.1 x 1.4 x 1.8 cm = volume: 3 mL. Normal appearance/no adnexal mass.   Pulsed Doppler evaluation of both ovaries demonstrates normal low-resistance arterial and venous waveforms.   Other findings   No abnormal free fluid. IMPRESSION: 1. Negative for adnexal torsion. 2. Unremarkable appearance of the uterus and endometrium. 3. Small amount of fluid in the endocervical canal.  CT:  IMPRESSION: 1. No acute intra-abdominal or intrapelvic abnormality. 2. 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 Recommend nonemergent pelvic ultrasound to evaluate for mullerian abnormality.   Upstream - 07/04/22 1457       Pregnancy Intention Screening   Does the patient want to become pregnant in the next year? No    Does the patient's partner want to become pregnant in the next year? No    Would the patient like to discuss contraceptive options today? No      Contraception Wrap Up   Current Method Hormonal Implant    End Method Hormonal Implant             Assessment:     1. Pelvic pain Pain worse last week  Has had pain for 3 years since delivery Has pain with sex   2. PID (acute pelvic inflammatory disease) +pelvic tenderness on exam  and +CMT Received Rocephin 500 mg IM left hip by Levy Pupa LPN Will rx doxycycline 100 mg 1 bid x 14 days and flagyl 500 mg 1 bid x 14 days no sex or alcohol while taking Meds ordered this encounter  Medications   doxycycline (VIBRA-TABS) 100 MG tablet    Sig: Take 1 bid x 14 days    Dispense:  28 tablet    Refill:  0    Order Specific Question:   Supervising Provider    Answer:   Tania Ade H [[id number]]   metroNIDAZOLE (FLAGYL) 500 MG tablet    Sig: Take 1 bid x 14 days no alcohol or sex while taking    Dispense:  28 tablet     Refill:  0    Order Specific Question:   Supervising Provider    Answer:   Elonda Husky, LUTHER H [[id number]]     3. Nexplanon in place Placed 12/21/21  4. Screening examination for STD (sexually transmitted disease) Urine sent for GC/CHL      Plan:     Follow up in 1 week for recheck  If pain persists consider pelvic floor PT, pt has medicaid

## 2022-07-07 LAB — GC/CHLAMYDIA PROBE AMP
Chlamydia trachomatis, NAA: NEGATIVE
Neisseria Gonorrhoeae by PCR: NEGATIVE

## 2022-07-11 ENCOUNTER — Ambulatory Visit (INDEPENDENT_AMBULATORY_CARE_PROVIDER_SITE_OTHER): Payer: Medicaid Other | Admitting: Adult Health

## 2022-07-11 ENCOUNTER — Encounter: Payer: Self-pay | Admitting: Adult Health

## 2022-07-11 VITALS — BP 140/92 | HR 78 | Ht 62.0 in | Wt 208.5 lb

## 2022-07-11 DIAGNOSIS — N941 Unspecified dyspareunia: Secondary | ICD-10-CM | POA: Diagnosis not present

## 2022-07-11 DIAGNOSIS — R102 Pelvic and perineal pain unspecified side: Secondary | ICD-10-CM

## 2022-07-11 DIAGNOSIS — Z975 Presence of (intrauterine) contraceptive device: Secondary | ICD-10-CM

## 2022-07-11 NOTE — Progress Notes (Signed)
  Subjective:     Patient ID: Erica Colon, female   DOB: 2001/01/27, 21 y.o.   MRN: [id number]  HPI Sunni is a 21 year old white female, married, G1P1, back in follow up, was treated for pelvic pain , and PID 07/04/22, and still has pelvic pain. She says has had pelvic pain since  C section in July 2020.  Last pap was negative 03/27/22.  Review of Systems +pelvic pain  +pain with sex +low back pain but not as often Reviewed past medical,surgical, social and family history. Reviewed medications and allergies.     Objective:   Physical Exam BP (!) 140/92 (BP Location: Left Arm, Patient Position: Sitting, Cuff Size: Normal)   Pulse 78   Ht 5\' 2"  (1.575 m)   Wt 208 lb 8 oz (94.6 kg)   LMP  (LMP Unknown)   BMI 38.14 kg/m     Skin warm and dry.Pelvic: external genitalia is normal in appearance no lesions, vagina: white discharge without odor,urethra has no lesions or masses noted, cervix:smooth, +CMT, uterus: normal size, shape and contour, + tender, no masses felt, adnexa: no masses, +bilateral  tenderness noted. Bladder is non tender and no masses felt.  Upstream - 07/11/22 1527       Pregnancy Intention Screening   Does the patient want to become pregnant in the next year? No    Does the patient's partner want to become pregnant in the next year? No    Would the patient like to discuss contraceptive options today? No      Contraception Wrap Up   Current Method Hormonal Implant    End Method Hormonal Implant            Examination chaperoned by Levy Pupa LPN  Assessment:      1. Pelvic pain Has had pain for 3 years, started after C-Section   ?endometriosis  2. Nexplanon in place Placed 12/21/21  3. Dyspareunia in female Has pain with sex, even with lubricate     Plan:     Return in 1 week to see Dr Nelda Marseille for evaluation

## 2022-07-18 ENCOUNTER — Encounter: Payer: Self-pay | Admitting: Obstetrics & Gynecology

## 2022-07-18 ENCOUNTER — Ambulatory Visit: Payer: Medicaid Other | Admitting: Obstetrics & Gynecology

## 2022-07-18 ENCOUNTER — Other Ambulatory Visit (HOSPITAL_BASED_OUTPATIENT_CLINIC_OR_DEPARTMENT_OTHER): Payer: Self-pay

## 2022-07-18 VITALS — BP 137/94 | HR 95 | Ht 62.0 in | Wt 205.4 lb

## 2022-07-18 DIAGNOSIS — Z98891 History of uterine scar from previous surgery: Secondary | ICD-10-CM | POA: Diagnosis not present

## 2022-07-18 DIAGNOSIS — R102 Pelvic and perineal pain unspecified side: Secondary | ICD-10-CM

## 2022-07-18 DIAGNOSIS — N941 Unspecified dyspareunia: Secondary | ICD-10-CM | POA: Diagnosis not present

## 2022-07-18 DIAGNOSIS — N942 Vaginismus: Secondary | ICD-10-CM | POA: Diagnosis not present

## 2022-07-18 MED ORDER — GABAPENTIN 300 MG PO CAPS
300.0000 mg | ORAL_CAPSULE | Freq: Three times a day (TID) | ORAL | 2 refills | Status: DC | PRN
  Filled 2022-07-18: qty 90, 30d supply, fill #0

## 2022-07-18 NOTE — Progress Notes (Signed)
GYN VISIT Patient name: Erica Colon MRN [id number]  Date of birth: [birth year] Chief Complaint:   Follow-up (Follow up on pelvic pain)  History of Present Illness:   Erica Colon is a 21 y.o. G30P1001 female being seen today for the following concerns:  Pelvic pain: Notes pelvic pain with sitting up, using the restroom.  Sharp stabbing pain both sides, but more on the left.  Pain 8/10- no improvement with OTC medications.  Not able to have IC due to pain.  This has been an ongoing issue since her delivery 2020.  Work up to date has been negative including pelvic US and lab work for PID (due to h/o PID).  Denies urinary or bowel complaints.  Pt has Nexplanon- no periods with this form of contraception.    No LMP recorded (lmp unknown). Patient has had an implant.     03/27/2022    8:37 AM 12/21/2021   10:49 AM 11/17/2021   10:18 AM 10/05/2021    3:09 PM 07/16/2020    8:58 AM  Depression screen PHQ 2/9  Decreased Interest 1 0 1 2 0  Down, Depressed, Hopeless 0 0 0 2 0  PHQ - 2 Score 1 0 1 4 0  Altered sleeping 2 2 2 3    Tired, decreased energy 2 2 2 3    Change in appetite 2 2 2 3    Feeling bad or failure about yourself  0 0 1 2   Trouble concentrating 0 0 2 2   Moving slowly or fidgety/restless 0 0 1 2   Suicidal thoughts 0 0 0 0   PHQ-9 Score 7 6 11 19    Difficult doing work/chores    Very difficult      Review of Systems:   Pertinent items are noted in HPI Denies fever/chills, dizziness, headaches, visual disturbances, fatigue, shortness of breath, chest pain, abdominal pain, vomiting, no problems with periods, bowel movements, urination unless otherwise stated above.  Pertinent History Reviewed:  Reviewed past medical,surgical, social, obstetrical and family history.  Reviewed problem list, medications and allergies. Physical Assessment:   Vitals:   07/18/22 1355  BP: (!) 137/94  Pulse: 95  Weight: 205 lb 6 oz (93.2 kg)  Height: 5\' 2"  (1.575 m)  Body mass index  is 37.56 kg/m.       Physical Examination:   General appearance: alert, well appearing, and in no distress  Psych: mood appropriate, normal affect  Skin: warm & dry   Cardiovascular: normal heart rate noted  Respiratory: normal respiratory effort, no distress  Abdomen: soft, non-tender, no rebound, no guarding  Pelvic: normal external genitalia.  Points to left side of mons as source of pain.  On exam normal vulva, contracted pelvic floor appreciated.  Pt needed instructions to help relax pelvic floor in order to completed exam.  Noted extreme discomfort with speculum and bimanual exam.  Cervix visualized no abnormalities noted. Normal size mobile uterus on exam, no adnexal masses appreciated.    Extremities: no edema, no calf tenderness bilaterally  Chaperone:  Dr. Owens Shark     Assessment & Plan:  1) Pelvic pain, dyspareunia- Vaginismus -discussed concerns for vaginismus and that treatment is often multi-factorial -also concerns for neuropathic pain- trial of gabapentin -recommend referral for pelvic floor therapy and strongly encouraged pt to consider therapy as there seemed to be some trauma around her delivery -plan for f/u in 72mos  Meds ordered this encounter  Medications   gabapentin (NEURONTIN) 300 MG capsule  Sig: Take 1 capsule (300 mg total) by mouth 3 (three) times daily as needed.    Dispense:  90 capsule    Refill:  2     Orders Placed This Encounter  Procedures   Ambulatory referral to Physical Therapy    Return in about 3 months (around 10/18/2022) for Medication follow up, with Dr. Charlotta Newton.   Myna Hidalgo, DO Attending Obstetrician & Gynecologist, [facility] for [employer], [facility]

## 2022-08-07 ENCOUNTER — Other Ambulatory Visit (HOSPITAL_BASED_OUTPATIENT_CLINIC_OR_DEPARTMENT_OTHER): Payer: Self-pay

## 2022-08-09 ENCOUNTER — Encounter (HOSPITAL_BASED_OUTPATIENT_CLINIC_OR_DEPARTMENT_OTHER): Payer: Self-pay | Admitting: Nurse Practitioner

## 2022-08-11 ENCOUNTER — Ambulatory Visit (HOSPITAL_BASED_OUTPATIENT_CLINIC_OR_DEPARTMENT_OTHER): Payer: Medicaid Other | Admitting: Nurse Practitioner

## 2022-11-02 ENCOUNTER — Ambulatory Visit: Payer: Medicaid Other | Admitting: Adult Health

## 2022-11-02 ENCOUNTER — Encounter: Payer: Self-pay | Admitting: Adult Health

## 2022-11-02 VITALS — BP 130/90 | HR 108 | Ht 62.0 in | Wt 210.5 lb

## 2022-11-02 DIAGNOSIS — R110 Nausea: Secondary | ICD-10-CM

## 2022-11-02 DIAGNOSIS — Z3046 Encounter for surveillance of implantable subdermal contraceptive: Secondary | ICD-10-CM | POA: Insufficient documentation

## 2022-11-02 DIAGNOSIS — Z319 Encounter for procreative management, unspecified: Secondary | ICD-10-CM | POA: Insufficient documentation

## 2022-11-02 DIAGNOSIS — R030 Elevated blood-pressure reading, without diagnosis of hypertension: Secondary | ICD-10-CM | POA: Insufficient documentation

## 2022-11-02 MED ORDER — PROMETHAZINE HCL 25 MG PO TABS: 25.0000 mg | ORAL_TABLET | Freq: Four times a day (QID) | ORAL | 1 refills | Status: DC | PRN

## 2022-11-02 NOTE — Progress Notes (Signed)
  Subjective:     Patient ID: Erica Colon, female   DOB: 03-09-2001, 22 y.o.   MRN: [id number]  HPI Erica Colon is a 23 year old white female,married, G1P1001, in for nexplanon removal. She wants to get pregnant. Has nausea this am.  Last pap was 03/27/22 NILM, negative GC/CHL.    Review of Systems For nexplanon removal Reviewed past medical,surgical, social and family history. Reviewed medications and allergies.     Objective:   Physical Exam BP (!) 130/90 (BP Location: Left Arm, Patient Position: Sitting, Cuff Size: Large)   Pulse (!) 108   Ht 5\' 2"  (1.575 m)   Wt 210 lb 8 oz (95.5 kg)   BMI 38.50 kg/m    Consent signed and time out called.   Right  arm cleansed with betadine, and injected with 1.5 cc 2% lidocaine and waited til numb.Under sterile technique a #11 blade was used to make small vertical incision, and a curved forceps was used to easily remove rod. Steri strips applied. Pressure dressing applied.  Fall risk is low  Upstream - 11/02/22 0931       Pregnancy Intention Screening   Does the patient want to become pregnant in the next year? Yes    Does the patient's partner want to become pregnant in the next year? Yes    Would the patient like to discuss contraceptive options today? No      Contraception Wrap Up   Current Method Hormonal Implant    End Method Pregnant/Seeking Pregnancy             Assessment:     1. Encounter for Nexplanon removal -keep clean and dry x 24 hours, no heavy lifting, keep steri strips on x 72 hours, Keep pressure dressing on x 24 hours. Follow up prn problems.   2. Patient desires pregnancy Take OTC PNV   Cal if no period in 3 months   3. Elevated BP without diagnosis of hypertension Will recheck BP in 2 weeks in office   4.Nausea Will rx phenergan for nausea  Meds ordered this encounter  Medications   promethazine (PHENERGAN) 25 MG tablet    Sig: Take 1 tablet (25 mg total) by mouth every 6 (six) hours as needed for  nausea or vomiting.    Dispense:  30 tablet    Refill:  1    Order Specific Question:   Supervising Provider    Answer:   Florian Buff [[id number]]    Plan:     Follow up in  2 weeks for BP check with nurse

## 2022-11-02 NOTE — Patient Instructions (Signed)
-  keep clean and dry x 24 hours, no heavy lifting, keep steri strips on x 72 hours, Keep pressure dressing on x 24 hours. Follow up prn problems.

## 2022-11-16 ENCOUNTER — Other Ambulatory Visit: Payer: Self-pay | Admitting: Adult Health

## 2022-11-16 ENCOUNTER — Ambulatory Visit (INDEPENDENT_AMBULATORY_CARE_PROVIDER_SITE_OTHER): Payer: Medicaid Other | Admitting: *Deleted

## 2022-11-16 VITALS — BP 124/92

## 2022-11-16 DIAGNOSIS — R030 Elevated blood-pressure reading, without diagnosis of hypertension: Secondary | ICD-10-CM

## 2022-11-16 MED ORDER — AMLODIPINE BESYLATE 10 MG PO TABS: 10.0000 mg | ORAL_TABLET | Freq: Every day | ORAL | 3 refills | Status: DC

## 2022-11-16 NOTE — Progress Notes (Signed)
Rx norvasc

## 2022-11-16 NOTE — Progress Notes (Signed)
   NURSE VISIT- BLOOD PRESSURE CHECK  SUBJECTIVE:  Erica Colon is a 22 y.o. G51P1001 female here for BP check. She is a GYN patient    HYPERTENSION ROS:  GYN patient: Taking medicines as instructed not applicable Headaches  Yes Chest pain No Shortness of breath No Swelling in legs/ankles No  OBJECTIVE:  BP (!) 124/92 (BP Location: Right Arm, Patient Position: Sitting, Cuff Size: Large)   Appearance alert, well appearing, and in no distress.  ASSESSMENT: GYN  blood pressure check  PLAN: Discussed with Derrek Monaco, AGNP   Recommendations: new prescription will be sent. Advised to reduce sodium and sugar intake Follow-up:  6 weeks w/ [name] for med f/u.     [name]  11/16/2022 11:32 AM

## 2022-11-30 ENCOUNTER — Ambulatory Visit (INDEPENDENT_AMBULATORY_CARE_PROVIDER_SITE_OTHER): Payer: Medicaid Other | Admitting: Adult Health

## 2022-11-30 ENCOUNTER — Encounter: Payer: Self-pay | Admitting: Adult Health

## 2022-11-30 VITALS — BP 122/80 | HR 91 | Ht 62.0 in | Wt 212.0 lb

## 2022-11-30 DIAGNOSIS — R5383 Other fatigue: Secondary | ICD-10-CM

## 2022-11-30 DIAGNOSIS — R110 Nausea: Secondary | ICD-10-CM | POA: Diagnosis not present

## 2022-11-30 DIAGNOSIS — Z3202 Encounter for pregnancy test, result negative: Secondary | ICD-10-CM | POA: Diagnosis not present

## 2022-11-30 DIAGNOSIS — R140 Abdominal distension (gaseous): Secondary | ICD-10-CM

## 2022-11-30 DIAGNOSIS — K5900 Constipation, unspecified: Secondary | ICD-10-CM | POA: Diagnosis not present

## 2022-11-30 DIAGNOSIS — N926 Irregular menstruation, unspecified: Secondary | ICD-10-CM | POA: Diagnosis not present

## 2022-11-30 LAB — POCT URINE PREGNANCY: Preg Test, Ur: NEGATIVE

## 2022-11-30 NOTE — Progress Notes (Signed)
  Subjective:     Patient ID: Erica Colon, female   DOB: 13-Jan-2001, 22 y.o.   MRN: [id number]  HPI Erica Colon is a 22 year old white female,married, G1P1001, in with multiple complaints. She had 1 +HPT then 2 negative HPTs. Her nexplanon was removed 11/02/22 and BP was elevated, bP was rechecked 11/16/21 and started on Norvasc and had swelling so stopped.   Last pap was negative 03/27/22.  Review of Systems Periods not regular +nausea +bloating +constipation +fatigue +headaches Had swelling with Norvasc so stopped Reviewed past medical,surgical, social and family history. Reviewed medications and allergies.     Objective:   Physical Exam BP 122/80 (BP Location: Left Arm, Patient Position: Sitting, Cuff Size: Large)   Pulse 91   Ht 5' 2"$  (1.575 m)   Wt 212 lb (96.2 kg)   BMI 38.78 kg/m  UPT is negative Skin warm and dry. Lungs: clear to ausculation bilaterally. Cardiovascular: regular rate and rhythm.    No pitting edema  Upstream - 11/30/22 1612       Pregnancy Intention Screening   Does the patient want to become pregnant in the next year? Yes    Does the patient's partner want to become pregnant in the next year? Yes    Would the patient like to discuss contraceptive options today? No      Contraception Wrap Up   Current Method Pregnant/Seeking Pregnancy    End Method Pregnant/Seeking Pregnancy    Contraception Counseling Provided No             Assessment:     1. Pregnancy examination or test, negative result - POCT urine pregnancy  2. Irregular periods UPT negative  Will check labs  - TSH + free T4 - Beta hCG quant (ref lab)  3. Nausea Phenergan makes her sleepy - Beta hCG quant (ref lab)  4. Fatigue, unspecified type Check labs  - CBC - Comprehensive metabolic panel - TSH + free T4  5. Constipation, unspecified constipation type Try apples and prune juisce  6. Bloating symptom    Plan:    BP good will keep check  Follow up prn

## 2022-12-01 LAB — COMPREHENSIVE METABOLIC PANEL
ALT: 80 IU/L — ABNORMAL HIGH (ref 0–32)
AST: 41 IU/L — ABNORMAL HIGH (ref 0–40)
Albumin/Globulin Ratio: 1.9 (ref 1.2–2.2)
Albumin: 5 g/dL (ref 4.0–5.0)
Alkaline Phosphatase: 84 IU/L (ref 44–121)
BUN/Creatinine Ratio: 16 (ref 9–23)
BUN: 13 mg/dL (ref 6–20)
Bilirubin Total: 0.2 mg/dL (ref 0.0–1.2)
CO2: 24 mmol/L (ref 20–29)
Calcium: 10.1 mg/dL (ref 8.7–10.2)
Chloride: 101 mmol/L (ref 96–106)
Creatinine, Ser: 0.81 mg/dL (ref 0.57–1.00)
Globulin, Total: 2.6 g/dL (ref 1.5–4.5)
Glucose: 63 mg/dL — ABNORMAL LOW (ref 70–99)
Potassium: 4.4 mmol/L (ref 3.5–5.2)
Sodium: 141 mmol/L (ref 134–144)
Total Protein: 7.6 g/dL (ref 6.0–8.5)
eGFR: 106 mL/min/{1.73_m2} (ref >59)

## 2022-12-01 LAB — CBC
Hematocrit: 47.4 % — ABNORMAL HIGH (ref 34.0–46.6)
Hemoglobin: 15.6 g/dL (ref 11.1–15.9)
MCH: 27.9 pg (ref 26.6–33.0)
MCHC: 32.9 g/dL (ref 31.5–35.7)
MCV: 85 fL (ref 79–97)
Platelets: 447 10*3/uL (ref 150–450)
RBC: 5.6 x10E6/uL — ABNORMAL HIGH (ref 3.77–5.28)
RDW: 13.6 % (ref 11.7–15.4)
WBC: 10.5 10*3/uL (ref 3.4–10.8)

## 2022-12-01 LAB — BETA HCG QUANT (REF LAB): hCG Quant: <1 m[IU]/mL

## 2022-12-01 LAB — TSH+FREE T4
Free T4: 1.16 ng/dL (ref 0.82–1.77)
TSH: 2.66 u[IU]/mL (ref 0.450–4.500)

## 2022-12-28 ENCOUNTER — Encounter: Payer: Self-pay | Admitting: Adult Health

## 2022-12-28 ENCOUNTER — Ambulatory Visit: Payer: Medicaid Other | Admitting: Adult Health

## 2022-12-28 VITALS — BP 131/86 | HR 78 | Ht 62.0 in | Wt 213.0 lb

## 2022-12-28 DIAGNOSIS — Z319 Encounter for procreative management, unspecified: Secondary | ICD-10-CM | POA: Diagnosis not present

## 2022-12-28 DIAGNOSIS — R748 Abnormal levels of other serum enzymes: Secondary | ICD-10-CM | POA: Insufficient documentation

## 2022-12-28 NOTE — Progress Notes (Signed)
  Subjective:     Patient ID: Erica Colon, female   DOB: 2001/01/18, 22 y.o.   MRN: [id number]  HPI Erica Colon is a 22 year old white female,married, G1P1001, in for BP check since stopping Norvasc and feels better. She had elevated LFTs 11/30/22.   Last pap was negative HPV, NILM 03/27/22  Review of Systems Feels better since stopping Norvasc, not tired Reviewed past medical,surgical, social and family history. Reviewed medications and allergies.     Objective:   Physical Exam BP 131/86 (BP Location: Left Arm, Patient Position: Sitting, Cuff Size: Large)   Pulse 78   Ht 5\' 2"  (1.575 m)   Wt 213 lb (96.6 kg)   LMP 12/19/2022   BMI 38.96 kg/m  Skin warm and dry.  Lungs: clear to ausculation bilaterally. Cardiovascular: regular rate and rhythm.      Upstream - 12/28/22 1039       Pregnancy Intention Screening   Does the patient want to become pregnant in the next year? Yes    Does the patient's partner want to become pregnant in the next year? Yes    Would the patient like to discuss contraceptive options today? No      Contraception Wrap Up   Current Method Pregnant/Seeking Pregnancy    End Method Pregnant/Seeking Pregnancy             Assessment:     1. Patient desires pregnancy Take PNV Discussed timing of sex   2. Elevated liver enzymes Will recheck in 4 weeks     Plan:     Follow up in 4 weeks for labs and ROS

## 2023-01-25 ENCOUNTER — Ambulatory Visit (INDEPENDENT_AMBULATORY_CARE_PROVIDER_SITE_OTHER): Payer: Self-pay | Admitting: Adult Health

## 2023-01-25 ENCOUNTER — Encounter: Payer: Self-pay | Admitting: Adult Health

## 2023-01-25 VITALS — BP 134/87 | HR 73 | Ht 62.0 in | Wt 214.0 lb

## 2023-01-25 DIAGNOSIS — Z319 Encounter for procreative management, unspecified: Secondary | ICD-10-CM

## 2023-01-25 DIAGNOSIS — R748 Abnormal levels of other serum enzymes: Secondary | ICD-10-CM

## 2023-01-25 DIAGNOSIS — N926 Irregular menstruation, unspecified: Secondary | ICD-10-CM

## 2023-01-25 NOTE — Progress Notes (Signed)
  Subjective:     Patient ID: Erica Colon, female   DOB: [birth year], 22 y.o.   MRN: [id number]  HPI Erica Colon is a 22 year old white female, married, G1P1001, in for BP check and to check CMP, had elevated LFTs.  Last pap was negative 03/27/22  Review of Systems Periods irregular Reviewed past medical,surgical, social and family history. Reviewed medications and allergies.     Objective:   Physical Exam BP 134/87 (BP Location: Left Arm, Patient Position: Sitting, Cuff Size: Large)   Pulse 73   Ht  (1.575 m)   Wt 214 lb (97.1 kg)   LMP 01/16/2023   BMI 39.14 kg/m  Skin warm and dry. Lungs: clear to ausculation bilaterally. Cardiovascular: regular rate and rhythm.    Fall risk is low  Upstream - 01/25/23 1120       Pregnancy Intention Screening   Does the patient want to become pregnant in the next year? Yes    Does the patient's partner want to become pregnant in the next year? Yes    Would the patient like to discuss contraceptive options today? No      Contraception Wrap Up   Current Method Pregnant/Seeking Pregnancy    End Method Pregnant/Seeking Pregnancy             Assessment:     1. Elevated liver enzymes Check CMP today  2. Irregular periods  3. Patient desires pregnancy Take PNV  Discussed timing of sex   Can take 6-18 months of active trying  Plan:     Follow up prn

## 2023-02-02 LAB — COMPREHENSIVE METABOLIC PANEL
ALT: 41 IU/L — ABNORMAL HIGH (ref 0–32)
AST: 25 IU/L (ref 0–40)
Albumin/Globulin Ratio: 1.7 (ref 1.2–2.2)
Albumin: 4.7 g/dL (ref 4.0–5.0)
Alkaline Phosphatase: 91 IU/L (ref 44–121)
BUN/Creatinine Ratio: 16 (ref 9–23)
BUN: 13 mg/dL (ref 6–20)
Bilirubin Total: 0.2 mg/dL (ref 0.0–1.2)
CO2: 22 mmol/L (ref 20–29)
Calcium: 9.6 mg/dL (ref 8.7–10.2)
Chloride: 102 mmol/L (ref 96–106)
Creatinine, Ser: 0.81 mg/dL (ref 0.57–1.00)
Globulin, Total: 2.7 g/dL (ref 1.5–4.5)
Glucose: 79 mg/dL (ref 70–99)
Potassium: 4.5 mmol/L (ref 3.5–5.2)
Sodium: 141 mmol/L (ref 134–144)
Total Protein: 7.4 g/dL (ref 6.0–8.5)
eGFR: 106 mL/min/{1.73_m2} (ref >59)

## 2023-02-13 ENCOUNTER — Other Ambulatory Visit: Payer: Self-pay | Admitting: Adult Health

## 2023-02-16 ENCOUNTER — Other Ambulatory Visit: Payer: Self-pay | Admitting: Adult Health

## 2023-02-16 DIAGNOSIS — N926 Irregular menstruation, unspecified: Secondary | ICD-10-CM

## 2023-02-16 NOTE — Progress Notes (Signed)
Ck QHCG  

## 2023-03-06 ENCOUNTER — Other Ambulatory Visit: Payer: Self-pay

## 2023-03-06 ENCOUNTER — Encounter (HOSPITAL_BASED_OUTPATIENT_CLINIC_OR_DEPARTMENT_OTHER): Payer: Self-pay | Admitting: Emergency Medicine

## 2023-03-06 DIAGNOSIS — R1084 Generalized abdominal pain: Secondary | ICD-10-CM | POA: Insufficient documentation

## 2023-03-06 DIAGNOSIS — R197 Diarrhea, unspecified: Secondary | ICD-10-CM | POA: Insufficient documentation

## 2023-03-06 LAB — CBC
HCT: 46.3 % — ABNORMAL HIGH (ref 36.0–46.0)
Hemoglobin: 15.2 g/dL — ABNORMAL HIGH (ref 12.0–15.0)
MCH: 27.9 pg (ref 26.0–34.0)
MCHC: 32.8 g/dL (ref 30.0–36.0)
MCV: 85.1 fL (ref 80.0–100.0)
Platelets: 421 10*3/uL — ABNORMAL HIGH (ref 150–400)
RBC: 5.44 MIL/uL — ABNORMAL HIGH (ref 3.87–5.11)
RDW: 14.1 % (ref 11.5–15.5)
WBC: 11.8 10*3/uL — ABNORMAL HIGH (ref 4.0–10.5)
nRBC: 0 % (ref 0.0–0.2)

## 2023-03-06 LAB — PREGNANCY, URINE: Preg Test, Ur: NEGATIVE

## 2023-03-06 NOTE — ED Triage Notes (Signed)
Abdo pain worse when bending. Diarrhea. Started today

## 2023-03-07 ENCOUNTER — Emergency Department (HOSPITAL_BASED_OUTPATIENT_CLINIC_OR_DEPARTMENT_OTHER)
Admission: EM | Admit: 2023-03-07 | Discharge: 2023-03-07 | Disposition: A | Discharge status: Home / Self Care | Payer: Self-pay | Attending: Emergency Medicine | Admitting: Emergency Medicine

## 2023-03-07 ENCOUNTER — Telehealth (HOSPITAL_BASED_OUTPATIENT_CLINIC_OR_DEPARTMENT_OTHER): Payer: Self-pay

## 2023-03-07 ENCOUNTER — Emergency Department (HOSPITAL_BASED_OUTPATIENT_CLINIC_OR_DEPARTMENT_OTHER): Payer: Self-pay

## 2023-03-07 DIAGNOSIS — R197 Diarrhea, unspecified: Secondary | ICD-10-CM

## 2023-03-07 DIAGNOSIS — R1084 Generalized abdominal pain: Secondary | ICD-10-CM

## 2023-03-07 LAB — COMPREHENSIVE METABOLIC PANEL
ALT: 32 U/L (ref 0–44)
AST: 19 U/L (ref 15–41)
Albumin: 4.9 g/dL (ref 3.5–5.0)
Alkaline Phosphatase: 68 U/L (ref 38–126)
Anion gap: 10 (ref 5–15)
BUN: 13 mg/dL (ref 6–20)
CO2: 26 mmol/L (ref 22–32)
Calcium: 10 mg/dL (ref 8.9–10.3)
Chloride: 102 mmol/L (ref 98–111)
Creatinine, Ser: 0.76 mg/dL (ref 0.44–1.00)
GFR, Estimated: >60 mL/min (ref >60)
Glucose, Bld: 93 mg/dL (ref 70–99)
Potassium: 3.8 mmol/L (ref 3.5–5.1)
Sodium: 138 mmol/L (ref 135–145)
Total Bilirubin: 0.3 mg/dL (ref 0.3–1.2)
Total Protein: 7.8 g/dL (ref 6.5–8.1)

## 2023-03-07 LAB — LIPASE, BLOOD: Lipase: 28 U/L (ref 11–51)

## 2023-03-07 LAB — URINALYSIS, ROUTINE W REFLEX MICROSCOPIC
Bilirubin Urine: NEGATIVE
Glucose, UA: NEGATIVE mg/dL
Hgb urine dipstick: NEGATIVE
Ketones, ur: NEGATIVE mg/dL
Leukocytes,Ua: NEGATIVE
Nitrite: NEGATIVE
Protein, ur: NEGATIVE mg/dL
Specific Gravity, Urine: 1.015 (ref 1.005–1.030)
pH: 7 (ref 5.0–8.0)

## 2023-03-07 MED ORDER — IOHEXOL 300 MG/ML  SOLN
100.0000 mL | Freq: Once | INTRAMUSCULAR | Status: AC | PRN
  Administered 2023-03-07: 85 mL via INTRAVENOUS

## 2023-03-07 MED ORDER — DICYCLOMINE HCL 20 MG PO TABS: 20.0000 mg | ORAL_TABLET | Freq: Two times a day (BID) | ORAL | 0 refills | Status: DC

## 2023-03-07 MED ORDER — ONDANSETRON 4 MG PO TBDP: 4.0000 mg | ORAL_TABLET | Freq: Three times a day (TID) | ORAL | 0 refills | Status: DC | PRN

## 2023-03-07 NOTE — ED Provider Notes (Signed)
[place] EMERGENCY DEPARTMENT AT [facility]  Provider Note  CSN: [id number] Arrival date & time: 03/06/23 2300  History Chief Complaint  Patient presents with   Abdominal Pain    Erica Colon is a 22 y.o. female with no significant PMH brought to the ED by father who supplements history. Patient reports she was in her usual state of health during the day but began to have sharp R sided abdominal pain around 2000hrs, associated with diarrhea. Pain is worse with bending and moving. No vomiting or fever. Feels tingling all over. Has had prior C-section, no other abdominal surgeries.    Home Medications Prior to Admission medications   Medication Sig Start Date End Date Taking? Authorizing Provider  dicyclomine (BENTYL) 20 MG tablet Take 1 tablet (20 mg total) by mouth 2 (two) times daily. 03/07/23  Yes Pollyann Savoy, MD  ondansetron (ZOFRAN-ODT) 4 MG disintegrating tablet Take 1 tablet (4 mg total) by mouth every 8 (eight) hours as needed for nausea or vomiting. 03/07/23  Yes Pollyann Savoy, MD     Allergies    Patient has no known allergies.   Review of Systems   Review of Systems Please see HPI for pertinent positives and negatives  Physical Exam BP (!) 164/113 (BP Location: Left Arm)   Pulse 91   Temp 98.8 F (37.1 C) (Oral)   Resp 17   LMP 02/13/2023 (Approximate)   SpO2 100%   Physical Exam Vitals and nursing note reviewed.  Constitutional:      Appearance: Normal appearance.  HENT:     Head: Normocephalic and atraumatic.     Nose: Nose normal.     Mouth/Throat:     Mouth: Mucous membranes are moist.  Eyes:     Extraocular Movements: Extraocular movements intact.     Conjunctiva/sclera: Conjunctivae normal.  Cardiovascular:     Rate and Rhythm: Normal rate.  Pulmonary:     Effort: Pulmonary effort is normal.     Breath sounds: Normal breath sounds.  Abdominal:     General: Abdomen is flat.     Palpations: Abdomen is soft.      Tenderness: There is abdominal tenderness in the right upper quadrant and right lower quadrant. There is no guarding. Negative signs include Murphy's sign, Rovsing's sign and McBurney's sign.  Musculoskeletal:        General: No swelling. Normal range of motion.     Cervical back: Neck supple.  Skin:    General: Skin is warm and dry.  Neurological:     General: No focal deficit present.     Mental Status: She is alert.  Psychiatric:        Mood and Affect: Mood normal.     ED Results / Procedures / Treatments   EKG None  Procedures Procedures  Medications Ordered in the ED Medications  iohexol (OMNIPAQUE) 300 MG/ML solution 100 mL (85 mLs Intravenous Contrast Given 03/07/23 0149)    Initial Impression and Plan  Patient here with R sided abdominal pain, some tenderness in RUQ and RLQ, mostly lower but no peritoneal signs. Labs done in triage show CBC with mild leukocytosis, CMP, lipase and UA are neg. Will send for CT to rule out appendicitis. Patient declines pain/nausea meds at this time.   ED Course   Clinical Course as of 03/07/23 0221  Wed Mar 07, 2023  0206 I personally viewed the images from radiology studies and agree with radiologist interpretation: CT is negative for acute  process. Patient remains well appearing in no distress. Suspect gastroenteritis as the cause of symptoms. Plan discharge with Rx for bentyl, Zofran. PCP follow up, RTED for any other concerns or worsening symptoms.  [CS]    Clinical Course User Index [CS] Pollyann Savoy, MD     MDM Rules/Calculators/A&P Medical Decision Making Problems Addressed: Diarrhea of presumed infectious origin: acute illness or injury Generalized abdominal pain: acute illness or injury  Amount and/or Complexity of Data Reviewed Labs: ordered. Decision-making details documented in ED Course. Radiology: ordered and independent interpretation performed. Decision-making details documented in ED  Course.  Risk Prescription drug management.     Final Clinical Impression(s) / ED Diagnoses Final diagnoses:  Generalized abdominal pain  Diarrhea of presumed infectious origin    Rx / DC Orders ED Discharge Orders          Ordered    dicyclomine (BENTYL) 20 MG tablet  2 times daily        03/07/23 0220    ondansetron (ZOFRAN-ODT) 4 MG disintegrating tablet  Every 8 hours PRN        03/07/23 0220             Pollyann Savoy, MD 03/07/23 0221

## 2023-10-13 ENCOUNTER — Other Ambulatory Visit: Payer: Self-pay

## 2023-10-13 DIAGNOSIS — R000 Tachycardia, unspecified: Secondary | ICD-10-CM | POA: Insufficient documentation

## 2023-10-13 DIAGNOSIS — R1030 Lower abdominal pain, unspecified: Secondary | ICD-10-CM | POA: Insufficient documentation

## 2023-10-13 DIAGNOSIS — Z1152 Encounter for screening for COVID-19: Secondary | ICD-10-CM | POA: Insufficient documentation

## 2023-10-13 LAB — COMPREHENSIVE METABOLIC PANEL
ALT: 43 U/L (ref 0–44)
AST: 26 U/L (ref 15–41)
Albumin: 4.9 g/dL (ref 3.5–5.0)
Alkaline Phosphatase: 64 U/L (ref 38–126)
Anion gap: 10 (ref 5–15)
BUN: 14 mg/dL (ref 6–20)
CO2: 28 mmol/L (ref 22–32)
Calcium: 10.1 mg/dL (ref 8.9–10.3)
Chloride: 102 mmol/L (ref 98–111)
Creatinine, Ser: 0.72 mg/dL (ref 0.44–1.00)
GFR, Estimated: >60 mL/min (ref >60)
Glucose, Bld: 90 mg/dL (ref 70–99)
Potassium: 3.7 mmol/L (ref 3.5–5.1)
Sodium: 140 mmol/L (ref 135–145)
Total Bilirubin: 0.5 mg/dL (ref 0.0–1.2)
Total Protein: 8.3 g/dL — ABNORMAL HIGH (ref 6.5–8.1)

## 2023-10-13 LAB — URINALYSIS, ROUTINE W REFLEX MICROSCOPIC
Bilirubin Urine: NEGATIVE
Glucose, UA: NEGATIVE mg/dL
Hgb urine dipstick: NEGATIVE
Ketones, ur: NEGATIVE mg/dL
Leukocytes,Ua: NEGATIVE
Nitrite: NEGATIVE
Protein, ur: NEGATIVE mg/dL
Specific Gravity, Urine: 1.011 (ref 1.005–1.030)
pH: 7 (ref 5.0–8.0)

## 2023-10-13 LAB — CBC
HCT: 43.6 % (ref 36.0–46.0)
Hemoglobin: 14.4 g/dL (ref 12.0–15.0)
MCH: 27.6 pg (ref 26.0–34.0)
MCHC: 33 g/dL (ref 30.0–36.0)
MCV: 83.5 fL (ref 80.0–100.0)
Platelets: 435 10*3/uL — ABNORMAL HIGH (ref 150–400)
RBC: 5.22 MIL/uL — ABNORMAL HIGH (ref 3.87–5.11)
RDW: 14 % (ref 11.5–15.5)
WBC: 12.6 10*3/uL — ABNORMAL HIGH (ref 4.0–10.5)
nRBC: 0 % (ref 0.0–0.2)

## 2023-10-13 LAB — LIPASE, BLOOD: Lipase: 30 U/L (ref 11–51)

## 2023-10-13 LAB — PREGNANCY, URINE: Preg Test, Ur: NEGATIVE

## 2023-10-13 NOTE — ED Triage Notes (Addendum)
 Pt POV from home reporting bilateral pelvic pain x3 weeks, worse on the left, and urinary frequency. Also reports period is 22 days late. 3 neg at home preg tests.

## 2023-10-14 ENCOUNTER — Encounter (HOSPITAL_BASED_OUTPATIENT_CLINIC_OR_DEPARTMENT_OTHER): Payer: Self-pay | Admitting: Emergency Medicine

## 2023-10-14 ENCOUNTER — Emergency Department (HOSPITAL_BASED_OUTPATIENT_CLINIC_OR_DEPARTMENT_OTHER): Payer: Self-pay

## 2023-10-14 ENCOUNTER — Emergency Department (HOSPITAL_BASED_OUTPATIENT_CLINIC_OR_DEPARTMENT_OTHER)
Admission: EM | Admit: 2023-10-14 | Discharge: 2023-10-14 | Disposition: A | Discharge status: Home / Self Care | Payer: Self-pay | Attending: Emergency Medicine | Admitting: Emergency Medicine

## 2023-10-14 ENCOUNTER — Emergency Department (HOSPITAL_BASED_OUTPATIENT_CLINIC_OR_DEPARTMENT_OTHER): Payer: Self-pay | Admitting: Radiology

## 2023-10-14 DIAGNOSIS — R000 Tachycardia, unspecified: Secondary | ICD-10-CM

## 2023-10-14 DIAGNOSIS — R1030 Lower abdominal pain, unspecified: Secondary | ICD-10-CM

## 2023-10-14 LAB — RESP PANEL BY RT-PCR (RSV, FLU A&B, COVID)  RVPGX2
Influenza A by PCR: NEGATIVE
Influenza B by PCR: NEGATIVE
Resp Syncytial Virus by PCR: NEGATIVE
SARS Coronavirus 2 by RT PCR: NEGATIVE

## 2023-10-14 LAB — TSH: TSH: 3.111 u[IU]/mL (ref 0.350–4.500)

## 2023-10-14 LAB — TROPONIN I (HIGH SENSITIVITY)
Troponin I (High Sensitivity): <2 ng/L (ref <18)
Troponin I (High Sensitivity): <2 ng/L (ref <18)

## 2023-10-14 LAB — D-DIMER, QUANTITATIVE: D-Dimer, Quant: 0.46 ug{FEU}/mL (ref 0.00–0.50)

## 2023-10-14 LAB — T4, FREE: Free T4: 0.98 ng/dL (ref 0.61–1.12)

## 2023-10-14 LAB — LACTIC ACID, PLASMA: Lactic Acid, Venous: 1.7 mmol/L (ref 0.5–1.9)

## 2023-10-14 MED ORDER — ONDANSETRON HCL 4 MG/2ML IJ SOLN
4.0000 mg | Freq: Once | INTRAMUSCULAR | Status: AC
  Administered 2023-10-14: 4 mg via INTRAVENOUS
  Filled 2023-10-14: qty 2

## 2023-10-14 MED ORDER — OXYCODONE-ACETAMINOPHEN 5-325 MG PO TABS: 1.0000 | ORAL_TABLET | Freq: Three times a day (TID) | ORAL | 0 refills | Status: DC | PRN

## 2023-10-14 MED ORDER — IOHEXOL 300 MG/ML  SOLN
100.0000 mL | Freq: Once | INTRAMUSCULAR | Status: AC | PRN
  Administered 2023-10-14: 100 mL via INTRAVENOUS

## 2023-10-14 MED ORDER — HYDROMORPHONE HCL 1 MG/ML IJ SOLN
1.0000 mg | Freq: Once | INTRAMUSCULAR | Status: AC
  Administered 2023-10-14: 1 mg via INTRAVENOUS
  Filled 2023-10-14: qty 1

## 2023-10-14 MED ORDER — LACTATED RINGERS IV BOLUS
1000.0000 mL | Freq: Once | INTRAVENOUS | Status: AC
  Administered 2023-10-14: 1000 mL via INTRAVENOUS

## 2023-10-14 MED ORDER — LORAZEPAM 2 MG/ML IJ SOLN
0.5000 mg | Freq: Once | INTRAMUSCULAR | Status: AC
  Administered 2023-10-14: 0.5 mg via INTRAVENOUS
  Filled 2023-10-14: qty 1

## 2023-10-14 MED ORDER — OXYCODONE HCL 5 MG PO TABS
5.0000 mg | ORAL_TABLET | Freq: Once | ORAL | Status: AC
  Administered 2023-10-14: 5 mg via ORAL
  Filled 2023-10-14: qty 1

## 2023-10-14 MED ORDER — ACETAMINOPHEN 500 MG PO TABS
1000.0000 mg | ORAL_TABLET | Freq: Once | ORAL | Status: AC
  Administered 2023-10-14: 1000 mg via ORAL
  Filled 2023-10-14: qty 2

## 2023-10-14 MED ORDER — FENTANYL CITRATE PF 50 MCG/ML IJ SOSY
100.0000 ug | PREFILLED_SYRINGE | Freq: Once | INTRAMUSCULAR | Status: AC
  Administered 2023-10-14: 100 ug via INTRAVENOUS
  Filled 2023-10-14: qty 2

## 2023-10-14 NOTE — ED Notes (Signed)
 Pt aware of the need for a urine... Unable to currently provide the sample.Marland KitchenMarland Kitchen

## 2023-10-14 NOTE — ED Notes (Signed)
 Provider aware of the V/S.Marland KitchenMarland Kitchen

## 2023-10-14 NOTE — ED Notes (Signed)
 Pt returned from radiology.

## 2023-10-14 NOTE — ED Notes (Signed)
 Patient transported to X-ray

## 2023-10-14 NOTE — ED Provider Notes (Signed)
  Physical Exam  BP (!) 155/102   Pulse (!) 112   Temp 97.8 F (36.6 C)   Resp 16   Ht 5' 2 (1.575 m)   Wt 90.7 kg   LMP 08/31/2023 (Exact Date)   SpO2 98%   BMI 36.58 kg/m   Physical Exam  Procedures  Procedures  ED Course / MDM     23yo female presents with concern for abdominal pain, lower for a few days, nausea, no vomiting, no fever.  CT abdomen pelvis and labs unremarkable.  Pelvic US /torsion r/o ordered and pending.   US  returned and is limited with no uterine abnormalities, nonvisualization of the ovaries.  Given the bilateral ovaries are difficult to visualize, has pain bilaterally, normal appearance on CT, have low suspicion at this time for ovarian torsion.  While she is in the emergency department she has developed tachycardia, reports she also has shortness of breath and chest pain.  Chest x-ray was performed and personally interpreted by me shows no evidence of pneumonia or pneumothorax.  EKG was completed and personally evaluated and interpreted by me showed sinus tachycardia.  Labs obtained show a negative D-dimer and have low suspicion for PE.  Troponin is negative and doubt ACS or myocarditis.  Delta troponins negative.  TSH and free T4 normal. Lactate normal.  Influenza/RSV/COVID testing negative.  Denies possibility of withdrawal from alcohol or benzodiazepines.  Was given additional fluids, ativan , pain medication with improvement HR 140s to 100s.  Possible tachycardia in setting of viral infection, dehydration.  Noted she has had prior admission for fever, tachycardia//SIRS wtihout identification of bacterial infection in hte past and autoimmune disorder was suggested at that time.  At this time, do not see emergent indication for admission to the hospital. Recommend continued supportive care, GYN and PCP follow up.      Dreama Longs, MD 10/15/23 [phone]

## 2023-10-14 NOTE — ED Notes (Signed)
 Reviewed AVS/discharge instruction with patient. Time allotted for and all questions answered. Patient is agreeable for d/c and escorted to ed exit by staff.

## 2023-10-14 NOTE — ED Notes (Signed)
 Pt endorses nausea, EDP Schlossman notified

## 2023-10-14 NOTE — ED Provider Notes (Signed)
 [place] EMERGENCY DEPARTMENT AT [facility] Provider Note   CSN: [id number] Arrival date & time: 10/13/23  2034     History  Chief Complaint  Patient presents with   Abdominal Pain    Erica Colon is a 23 y.o. female.  Here with pain just to the right of her mons intermittently for awhile but more severe last few weeks. Also is 24 days late for menstrual cycle which is unusual for her. Has some urinary frequency but no other urinary changes.    Abdominal Pain      Home Medications Prior to Admission medications   Medication Sig Start Date End Date Taking? Authorizing Provider  dicyclomine  (BENTYL ) 20 MG tablet Take 1 tablet (20 mg total) by mouth 2 (two) times daily. 03/07/23   Roselyn Carlin NOVAK, MD  ondansetron  (ZOFRAN -ODT) 4 MG disintegrating tablet Take 1 tablet (4 mg total) by mouth every 8 (eight) hours as needed for nausea or vomiting. 03/07/23   Roselyn Carlin NOVAK, MD      Allergies    Patient has no known allergies.    Review of Systems   Review of Systems  Gastrointestinal:  Positive for abdominal pain.    Physical Exam Updated Vital Signs BP (!) 155/102   Pulse (!) 112   Temp 97.8 F (36.6 C)   Resp 16   Ht 5' 2 (1.575 m)   Wt 90.7 kg   LMP 08/31/2023 (Exact Date)   SpO2 98%   BMI 36.58 kg/m  Physical Exam  ED Results / Procedures / Treatments   Labs (all labs ordered are listed, but only abnormal results are displayed) Labs Reviewed  COMPREHENSIVE METABOLIC PANEL - Abnormal; Notable for the following components:      Result Value   Total Protein 8.3 (*)    All other components within normal limits  CBC - Abnormal; Notable for the following components:   WBC 12.6 (*)    RBC 5.22 (*)    Platelets 435 (*)    All other components within normal limits  URINALYSIS, ROUTINE W REFLEX MICROSCOPIC  PREGNANCY, URINE  LIPASE, BLOOD    EKG None  Radiology CT ABDOMEN PELVIS W CONTRAST Result Date: 10/14/2023 CLINICAL DATA:   Acute, nonlocalized abdominal pain EXAM: CT ABDOMEN AND PELVIS WITH CONTRAST TECHNIQUE: Multidetector CT imaging of the abdomen and pelvis was performed using the standard protocol following bolus administration of intravenous contrast. RADIATION DOSE REDUCTION: This exam was performed according to the departmental dose-optimization program which includes automated exposure control, adjustment of the mA and/or kV according to patient size and/or use of iterative reconstruction technique. CONTRAST:  OMNIPAQUE  IOHEXOL  300 MG/ML  SOLN COMPARISON:  03/07/2023 abdominal CT. FINDINGS: Lower chest:  No contributory findings. Hepatobiliary: No focal liver abnormality.No evidence of biliary obstruction or stone. Pancreas: Unremarkable. Spleen: Unremarkable. Adrenals/Urinary Tract: Negative adrenals. No hydronephrosis or stone. Unremarkable bladder. Stomach/Bowel:  No obstruction. No appendicitis. Vascular/Lymphatic: No acute vascular abnormality. No mass or adenopathy. 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 Other: No ascites or pneumoperitoneum. Musculoskeletal: No acute abnormalities. IMPRESSION: No acute finding or explanation for symptoms. Electronically Signed   By: Dorn Roulette M.D.   On: 10/14/2023 05:34    Procedures Procedures    Medications Ordered in ED Medications  fentaNYL  (SUBLIMAZE ) injection 100 mcg (has no administration in time range)  HYDROmorphone  (DILAUDID ) injection 1 mg (1 mg Intravenous Given 10/14/23 0425)  ondansetron  (ZOFRAN ) injection 4 mg (4 mg  Intravenous Given 10/14/23 0423)  lactated ringers  bolus 1,000 mL (1,000 mLs Intravenous New Bag/Given 10/14/23 0426)  iohexol  (OMNIPAQUE ) 300 MG/ML solution 100 mL (100 mLs Intravenous Contrast Given 10/14/23 0440)    ED Course/ Medical Decision Making/ A&P                                 Medical Decision Making Amount and/or Complexity of Data  Reviewed Labs: ordered. Radiology: ordered.  Risk OTC drugs. Prescription drug management.   Patient with some lower abdominal upper pelvic pain.'s been going on for a while but got worse here recently.  Multiple negative pregnancy test at home but is 24 days late for her period could be retaining products of menstrual cycle.  No vaginal discharge with abnormal or urinary symptoms.  CT scan reassuring.  Pain still persistent still mildly tachycardic.  Care transferred pending ultrasound to evaluate ovaries and reproductive organs with reevaluation and disposition.   Final Clinical Impression(s) / ED Diagnoses Final diagnoses:  None    Rx / DC Orders ED Discharge Orders     None         Ahria Slappey, Selinda, MD 10/21/23 2344

## 2023-10-24 ENCOUNTER — Encounter: Payer: Self-pay | Admitting: Adult Health

## 2023-10-24 ENCOUNTER — Ambulatory Visit (INDEPENDENT_AMBULATORY_CARE_PROVIDER_SITE_OTHER): Payer: Self-pay | Admitting: Adult Health

## 2023-10-24 VITALS — BP 141/97 | HR 94 | Ht 62.0 in | Wt 224.0 lb

## 2023-10-24 DIAGNOSIS — N926 Irregular menstruation, unspecified: Secondary | ICD-10-CM | POA: Insufficient documentation

## 2023-10-24 DIAGNOSIS — D72829 Elevated white blood cell count, unspecified: Secondary | ICD-10-CM | POA: Insufficient documentation

## 2023-10-24 DIAGNOSIS — N941 Unspecified dyspareunia: Secondary | ICD-10-CM

## 2023-10-24 DIAGNOSIS — R102 Pelvic and perineal pain: Secondary | ICD-10-CM

## 2023-10-24 DIAGNOSIS — I10 Essential (primary) hypertension: Secondary | ICD-10-CM | POA: Insufficient documentation

## 2023-10-24 DIAGNOSIS — Z3202 Encounter for pregnancy test, result negative: Secondary | ICD-10-CM

## 2023-10-24 LAB — POCT URINE PREGNANCY: Preg Test, Ur: NEGATIVE

## 2023-10-24 MED ORDER — AZITHROMYCIN 500 MG PO TABS: ORAL_TABLET | ORAL | 0 refills | Status: DC

## 2023-10-24 MED ORDER — HYDROCHLOROTHIAZIDE 12.5 MG PO CAPS: 12.5000 mg | ORAL_CAPSULE | Freq: Every day | ORAL | 6 refills | Status: DC
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
## 2023-10-25 LAB — CBC WITH DIFFERENTIAL/PLATELET
Basophils Absolute: 0.1 10*3/uL (ref 0.0–0.2)
Basos: 1 %
EOS (ABSOLUTE): 0.1 10*3/uL (ref 0.0–0.4)
Eos: 1 %
Hematocrit: 47 % — ABNORMAL HIGH (ref 34.0–46.6)
Hemoglobin: 14.6 g/dL (ref 11.1–15.9)
Immature Grans (Abs): 0 10*3/uL (ref 0.0–0.1)
Immature Granulocytes: 0 %
Lymphocytes Absolute: 3.5 10*3/uL — ABNORMAL HIGH (ref 0.7–3.1)
Lymphs: 39 %
MCH: 27.1 pg (ref 26.6–33.0)
MCHC: 31.1 g/dL — ABNORMAL LOW (ref 31.5–35.7)
MCV: 87 fL (ref 79–97)
Monocytes Absolute: 0.8 10*3/uL (ref 0.1–0.9)
Monocytes: 9 %
Neutrophils Absolute: 4.6 10*3/uL (ref 1.4–7.0)
Neutrophils: 50 %
Platelets: 412 10*3/uL (ref 150–450)
RBC: 5.39 x10E6/uL — ABNORMAL HIGH (ref 3.77–5.28)
RDW: 13.5 % (ref 11.7–15.4)
WBC: 9.1 10*3/uL (ref 3.4–10.8)

## 2023-11-08 ENCOUNTER — Encounter: Payer: Self-pay | Admitting: Adult Health

## 2023-11-08 ENCOUNTER — Ambulatory Visit (INDEPENDENT_AMBULATORY_CARE_PROVIDER_SITE_OTHER): Payer: Self-pay | Admitting: Adult Health

## 2023-11-08 VITALS — BP 148/97 | HR 85 | Ht 62.0 in | Wt 224.0 lb

## 2023-11-08 DIAGNOSIS — Z319 Encounter for procreative management, unspecified: Secondary | ICD-10-CM

## 2023-11-08 DIAGNOSIS — R102 Pelvic and perineal pain: Secondary | ICD-10-CM

## 2023-11-08 DIAGNOSIS — I10 Essential (primary) hypertension: Secondary | ICD-10-CM

## 2023-11-08 DIAGNOSIS — N941 Unspecified dyspareunia: Secondary | ICD-10-CM

## 2023-11-08 NOTE — Progress Notes (Signed)
  Subjective:     Patient ID: Erica Colon, female   DOB: 2001/09/03, 23 y.o.   MRN: [id number]  HPI Erica Colon is a 23 year old white female, married, G1P1001 back in follow up on pelvic pain and BP check. She is feeling better, still has some discomfort LLQ at times. Has had sex and it was not has painful. She took 1 gm azithromycin after 10/24/23 visit.     Component Value Date/Time   DIAGPAP  03/27/2022 0838    - Negative for intraepithelial lesion or malignancy (NILM)   ADEQPAP  03/27/2022 0838    Satisfactory for evaluation; transformation zone component ABSENT.    Review of Systems Pelvic pain is better, still some discomfort LLQ Pain with sex is less Got period 11/05/23 Reviewed past medical,surgical, social and family history. Reviewed medications and allergies.     Objective:   Physical Exam BP (!) 148/97 (BP Location: Right Arm, Patient Position: Sitting, Cuff Size: Normal)   Pulse 85   Ht 5\' 2"  (1.575 m)   Wt 224 lb (101.6 kg)   LMP 11/05/2023 (Exact Date)   BMI 40.97 kg/m     Skin warm and dry.Pelvic: external genitalia is normal in appearance no lesions, vagina: scant blood,urethra has no lesions or masses noted, cervix:smooth and bulbous, uterus: normal size, shape and contour, non tender, no masses felt, adnexa: no masses, mild LLQ tenderness noted. Bladder is non tender and no masses felt.  Did better with exam, was not as uncomfortable.   Upstream - 11/08/23 1202       Pregnancy Intention Screening   Does the patient want to become pregnant in the next year? Yes    Does the patient's partner want to become pregnant in the next year? Yes    Would the patient like to discuss contraceptive options today? No      Contraception Wrap Up   Current Method Pregnant/Seeking Pregnancy    End Method Pregnant/Seeking Pregnancy    Contraception Counseling Provided No            Examination chaperoned by [name] LPN  Assessment:     1. Pelvic pain (Primary) Pain  is better, still some discomfort LLQ  2. Dyspareunia in female Sex was better, not has painful  3. Patient desires pregnancy Check progesterone level 11/26/23 Take OTC PNV Discussed timing of sex - Progesterone  4. Hypertension, unspecified type Continue Microzide 12.5 mg 1 daily Will recheck BP in 3 months      Plan:     Follow up in 3 months for ROS and BP check

## 2023-11-27 LAB — PROGESTERONE: Progesterone: 3.9 ng/mL

## 2023-12-05 ENCOUNTER — Other Ambulatory Visit: Payer: Self-pay | Admitting: Adult Health

## 2023-12-05 DIAGNOSIS — Z319 Encounter for procreative management, unspecified: Secondary | ICD-10-CM

## 2023-12-05 NOTE — Progress Notes (Signed)
 Ck progesterone level March 18

## 2023-12-26 LAB — PROGESTERONE: Progesterone: 6.4 ng/mL

## 2024-01-07 ENCOUNTER — Ambulatory Visit: Admitting: *Deleted

## 2024-01-07 ENCOUNTER — Encounter: Payer: Self-pay | Admitting: *Deleted

## 2024-01-07 ENCOUNTER — Other Ambulatory Visit: Payer: Self-pay | Admitting: Adult Health

## 2024-01-07 VITALS — BP 140/85 | HR 109 | Ht 62.0 in | Wt 222.0 lb

## 2024-01-07 DIAGNOSIS — Z3201 Encounter for pregnancy test, result positive: Secondary | ICD-10-CM

## 2024-01-07 LAB — POCT URINE PREGNANCY: Preg Test, Ur: POSITIVE — AB

## 2024-01-07 MED ORDER — ONDANSETRON HCL 4 MG PO TABS: 4.0000 mg | ORAL_TABLET | Freq: Three times a day (TID) | ORAL | 1 refills | Status: DC | PRN

## 2024-01-07 NOTE — Progress Notes (Signed)
 Rx zofran

## 2024-01-07 NOTE — Progress Notes (Signed)
   NURSE VISIT- PREGNANCY CONFIRMATION   SUBJECTIVE:  Erica Colon is a 23 y.o. G2P1001 female at [redacted]w[redacted]d by certain LMP of Patient's last menstrual period was 12/05/2023. Here for pregnancy confirmation.  Home pregnancy test: positive x 2.   She reports nausea and vomiting.  She is taking prenatal vitamins.    OBJECTIVE:  BP (!) 140/85 (BP Location: Right Arm, Patient Position: Sitting, Cuff Size: Large)   Pulse (!) 109   Ht 5\' 2"  (1.575 m)   Wt 222 lb (100.7 kg)   LMP 12/05/2023   BMI 40.60 kg/m  1st BP reading was 142/82 pulse 105.  Appears well, in no apparent distress  Results for orders placed or performed in visit on 01/07/24 (from the past 24 hours)  POCT urine pregnancy   Collection Time: 01/07/24  9:32 AM  Result Value Ref Range   Preg Test, Ur Positive (A) Negative    ASSESSMENT: Positive pregnancy test, [redacted]w[redacted]d by LMP    PLAN: Schedule for dating ultrasound in 4 weeks Prenatal vitamins: continue   Nausea medicines: requested-note routed to [facility] to send prescription   OB packet given: Yes  [name]  01/07/2024 9:38 AM

## 2024-01-28 ENCOUNTER — Emergency Department (HOSPITAL_BASED_OUTPATIENT_CLINIC_OR_DEPARTMENT_OTHER)

## 2024-01-28 ENCOUNTER — Encounter (HOSPITAL_BASED_OUTPATIENT_CLINIC_OR_DEPARTMENT_OTHER): Payer: Self-pay

## 2024-01-28 ENCOUNTER — Emergency Department (HOSPITAL_BASED_OUTPATIENT_CLINIC_OR_DEPARTMENT_OTHER)
Admission: EM | Admit: 2024-01-28 | Discharge: 2024-01-29 | Disposition: A | Discharge status: Home / Self Care | Attending: Emergency Medicine | Admitting: Emergency Medicine

## 2024-01-28 ENCOUNTER — Other Ambulatory Visit: Payer: Self-pay

## 2024-01-28 DIAGNOSIS — R197 Diarrhea, unspecified: Secondary | ICD-10-CM | POA: Diagnosis not present

## 2024-01-28 DIAGNOSIS — Z3A01 Less than 8 weeks gestation of pregnancy: Secondary | ICD-10-CM | POA: Insufficient documentation

## 2024-01-28 DIAGNOSIS — O219 Vomiting of pregnancy, unspecified: Secondary | ICD-10-CM | POA: Insufficient documentation

## 2024-01-28 DIAGNOSIS — D72829 Elevated white blood cell count, unspecified: Secondary | ICD-10-CM

## 2024-01-28 DIAGNOSIS — R112 Nausea with vomiting, unspecified: Secondary | ICD-10-CM

## 2024-01-28 DIAGNOSIS — Z3491 Encounter for supervision of normal pregnancy, unspecified, first trimester: Secondary | ICD-10-CM

## 2024-01-28 LAB — URINALYSIS, ROUTINE W REFLEX MICROSCOPIC
Glucose, UA: NEGATIVE mg/dL
Hgb urine dipstick: NEGATIVE
Ketones, ur: >80 mg/dL — AB
Leukocytes,Ua: NEGATIVE
Nitrite: NEGATIVE
Protein, ur: 30 mg/dL — AB
Specific Gravity, Urine: 1.035 — ABNORMAL HIGH (ref 1.005–1.030)
pH: 6 (ref 5.0–8.0)

## 2024-01-28 LAB — CBC
HCT: 50.1 % — ABNORMAL HIGH (ref 36.0–46.0)
Hemoglobin: 16.7 g/dL — ABNORMAL HIGH (ref 12.0–15.0)
MCH: 28.3 pg (ref 26.0–34.0)
MCHC: 33.3 g/dL (ref 30.0–36.0)
MCV: 84.9 fL (ref 80.0–100.0)
Platelets: 448 10*3/uL — ABNORMAL HIGH (ref 150–400)
RBC: 5.9 MIL/uL — ABNORMAL HIGH (ref 3.87–5.11)
RDW: 15.2 % (ref 11.5–15.5)
WBC: 29.1 10*3/uL — ABNORMAL HIGH (ref 4.0–10.5)
nRBC: 0 % (ref 0.0–0.2)

## 2024-01-28 LAB — COMPREHENSIVE METABOLIC PANEL WITH GFR
ALT: 22 U/L (ref 0–44)
AST: 17 U/L (ref 15–41)
Albumin: 4.7 g/dL (ref 3.5–5.0)
Alkaline Phosphatase: 61 U/L (ref 38–126)
Anion gap: 11 (ref 5–15)
BUN: 13 mg/dL (ref 6–20)
CO2: 23 mmol/L (ref 22–32)
Calcium: 9.5 mg/dL (ref 8.9–10.3)
Chloride: 101 mmol/L (ref 98–111)
Creatinine, Ser: 0.84 mg/dL (ref 0.44–1.00)
GFR, Estimated: >60 mL/min (ref >60)
Glucose, Bld: 130 mg/dL — ABNORMAL HIGH (ref 70–99)
Potassium: 3.8 mmol/L (ref 3.5–5.1)
Sodium: 135 mmol/L (ref 135–145)
Total Bilirubin: 0.3 mg/dL (ref 0.0–1.2)
Total Protein: 7.7 g/dL (ref 6.5–8.1)

## 2024-01-28 LAB — LIPASE, BLOOD: Lipase: 18 U/L (ref 11–51)

## 2024-01-28 LAB — HCG, QUANTITATIVE, PREGNANCY: hCG, Beta Chain, Quant, S: 96979 m[IU]/mL — ABNORMAL HIGH (ref <5)

## 2024-01-28 LAB — PREGNANCY, URINE: Preg Test, Ur: POSITIVE — AB

## 2024-01-28 MED ORDER — DEXTROSE 5 % IV BOLUS
1000.0000 mL | Freq: Once | INTRAVENOUS | Status: AC
  Administered 2024-01-28: 1000 mL via INTRAVENOUS

## 2024-01-28 MED ORDER — LACTATED RINGERS IV BOLUS
1000.0000 mL | Freq: Once | INTRAVENOUS | Status: AC
  Administered 2024-01-28: 1000 mL via INTRAVENOUS

## 2024-01-28 MED ORDER — METOCLOPRAMIDE HCL 5 MG/ML IJ SOLN
10.0000 mg | Freq: Once | INTRAMUSCULAR | Status: AC
Start: 2024-01-28 — End: 2024-01-28
  Administered 2024-01-28: 10 mg via INTRAVENOUS
  Filled 2024-01-28: qty 2

## 2024-01-28 NOTE — ED Triage Notes (Addendum)
 In for eval of abd pain onset yesterday then nausea, vomiting, and diarrhea started this afternoon. [redacted] weeks pregnant. G2, P1. Is scheduled for first US  on on 02/04/2024. Denies vaginal bleeding. LMP 12/05/2023.

## 2024-01-28 NOTE — ED Notes (Signed)
 Pt transported to [place]

## 2024-01-28 NOTE — ED Provider Notes (Signed)
 [place] EMERGENCY DEPARTMENT AT [facility] Provider Note   CSN: [id number] Arrival date & time: 01/28/24  1721     History  Chief Complaint  Patient presents with   Emesis    Erica Colon is a 23 y.o. female.  This is an otherwise healthy G2, P1 here today for nausea, vomiting diarrhea that started this afternoon.  Patient is having some pain on the left side of her abdomen.  Patient [redacted] weeks pregnant.  Her last menstrual period was February 26 of this year.  She has not had any vaginal bleeding or vaginal discharge.   Emesis      Home Medications Prior to Admission medications   Medication Sig Start Date End Date Taking? Authorizing Provider  hydrochlorothiazide  (MICROZIDE ) 12.5 MG capsule Take 1 capsule by mouth daily. 10/24/23  Yes [provider]  ondansetron  (ZOFRAN ) 4 MG tablet Take 1 tablet (4 mg total) by mouth every 8 (eight) hours as needed. 01/07/24   Javan Messing, NP  Prenatal Vit-Fe Fumarate-FA (PRENATAL VITAMIN PO) Take by mouth.   Yes [provider]      Allergies    Patient has no known allergies.    Review of Systems   Review of Systems  Gastrointestinal:  Positive for vomiting.    Physical Exam Updated Vital Signs BP 136/76   Pulse 100   Temp 98 F (36.7 C)   Resp 18   Ht 5\' 2"  (1.575 m)   Wt 99.8 kg   LMP 12/05/2023   SpO2 98%   BMI 40.24 kg/m  Physical Exam Vitals reviewed.  Constitutional:      Appearance: She is not toxic-appearing.  Cardiovascular:     Rate and Rhythm: Normal rate.     Pulses: Normal pulses.  Abdominal:     General: Abdomen is flat. There is no distension.     Palpations: Abdomen is soft. There is no mass.     Tenderness: There is no abdominal tenderness. There is no guarding.  Musculoskeletal:        General: Normal range of motion.  Skin:    General: Skin is warm and dry.  Neurological:     General: No focal deficit present.     Mental Status: She is alert.      ED Results / Procedures / Treatments   Labs (all labs ordered are listed, but only abnormal results are displayed) Labs Reviewed  COMPREHENSIVE METABOLIC PANEL WITH GFR - Abnormal; Notable for the following components:      Result Value   Glucose, Bld 130 (*)    All other components within normal limits  CBC - Abnormal; Notable for the following components:   WBC 29.1 (*)    RBC 5.90 (*)    Hemoglobin 16.7 (*)    HCT 50.1 (*)    Platelets 448 (*)    All other components within normal limits  URINALYSIS, ROUTINE W REFLEX MICROSCOPIC - Abnormal; Notable for the following components:   APPearance HAZY (*)    Specific Gravity, Urine 1.035 (*)    Bilirubin Urine SMALL (*)    Ketones, ur >80 (*)    Protein, ur 30 (*)    Bacteria, UA RARE (*)    All other components within normal limits  PREGNANCY, URINE - Abnormal; Notable for the following components:   Preg Test, Ur POSITIVE (*)    All other components within normal limits  LIPASE, BLOOD  HCG, QUANTITATIVE, PREGNANCY    EKG None  Radiology No results found.  Procedures Procedures    Medications Ordered in ED Medications  metoCLOPramide  (REGLAN ) injection 10 mg (10 mg Intravenous Given 01/28/24 2211)  dextrose  5 % bolus 1,000 mL (1,000 mLs Intravenous New Bag/Given 01/28/24 2252)  lactated ringers  bolus 1,000 mL (0 mLs Intravenous Stopped 01/28/24 2311)    ED Course/ Medical Decision Making/ A&P                                 Medical Decision Making 23 year old female here today for nausea, vomiting, diarrhea.  Is currently pregnant.  Plan-on exam, patient overall looks well.  She has a heart rate of 100, is normotensive.  Patient says that she has had some crampy abdominal pain, however her abdomen is soft, nontender on exam.  Patient had labs drawn at triage which do show some hemoconcentration with an elevated hemoglobin and leukocytosis of 29.  Will provide the patient some IV fluids.  With the elevated  white count, do have some concern for potential of infection, however symptoms could simply be enteritis/gastroenteritis.  With her crampy abdominal discomfort, will obtain a transvaginal ultrasound to assess for ectopic although I think this is less likely at this time.  Patient's urine has ketones in it.  Have provided some D5 LR.  Patient with rare bacteria in her urine, however there are 11-20 squamous cells in that sample.  Patient signed out to Dr. Bolivar Bushman pending ultrasound.  Amount and/or Complexity of Data Reviewed Labs: ordered. Radiology: ordered.  Risk Prescription drug management.           Final Clinical Impression(s) / ED Diagnoses Final diagnoses:  Nausea and vomiting, unspecified vomiting type  Diarrhea, unspecified type    Rx / DC Orders ED Discharge Orders     None         Nathanael Baker, DO 01/28/24 2327

## 2024-01-29 ENCOUNTER — Other Ambulatory Visit: Payer: Self-pay | Admitting: Obstetrics & Gynecology

## 2024-01-29 DIAGNOSIS — O3680X0 Pregnancy with inconclusive fetal viability, not applicable or unspecified: Secondary | ICD-10-CM

## 2024-01-29 DIAGNOSIS — O34219 Maternal care for unspecified type scar from previous cesarean delivery: Secondary | ICD-10-CM

## 2024-01-29 DIAGNOSIS — Z8759 Personal history of other complications of pregnancy, childbirth and the puerperium: Secondary | ICD-10-CM

## 2024-01-29 NOTE — ED Provider Notes (Signed)
 Care of the patient assumed at shift change. 1st trimester pregnancy with N/V, pending US . Noted to have isolated leukocytosis of unclear etiology. Physical Exam  BP (!) 146/69   Pulse 99   Temp 98 F (36.7 C)   Resp 18   Ht 5\' 2"  (1.575 m)   Wt 99.8 kg   LMP 12/05/2023   SpO2 97%   BMI 40.24 kg/m   Physical Exam  Procedures  Procedures  ED Course / MDM   Clinical Course as of 01/29/24 0417  Tue Jan 29, 2024  0416 I personally viewed the images from radiology studies and agree with radiologist interpretation: US  shows live IUP, no other complications. Patient reports she is feeling better and would like to go home. Recommend OB follow up, RTED, preferably MAU if symptoms persist.  [CS]    Clinical Course User Index [CS] Charmayne Cooper, MD   Medical Decision Making Amount and/or Complexity of Data Reviewed Labs: ordered. Radiology: ordered.  Risk Prescription drug management.          Charmayne Cooper, MD 01/29/24 [phone]

## 2024-02-04 ENCOUNTER — Ambulatory Visit: Admitting: Radiology

## 2024-02-04 DIAGNOSIS — Z3A08 8 weeks gestation of pregnancy: Secondary | ICD-10-CM

## 2024-02-04 DIAGNOSIS — Z6841 Body Mass Index (BMI) 40.0 and over, adult: Secondary | ICD-10-CM | POA: Diagnosis not present

## 2024-02-04 DIAGNOSIS — O3680X0 Pregnancy with inconclusive fetal viability, not applicable or unspecified: Secondary | ICD-10-CM

## 2024-02-04 DIAGNOSIS — Z8759 Personal history of other complications of pregnancy, childbirth and the puerperium: Secondary | ICD-10-CM

## 2024-02-04 DIAGNOSIS — O34219 Maternal care for unspecified type scar from previous cesarean delivery: Secondary | ICD-10-CM

## 2024-02-04 NOTE — Progress Notes (Signed)
 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

## 2024-02-06 ENCOUNTER — Ambulatory Visit: Payer: Self-pay | Admitting: Adult Health

## 2024-02-23 ENCOUNTER — Encounter (HOSPITAL_COMMUNITY): Payer: Self-pay | Admitting: *Deleted

## 2024-02-23 ENCOUNTER — Inpatient Hospital Stay (HOSPITAL_COMMUNITY)
Admission: AD | Admit: 2024-02-23 | Discharge: 2024-02-23 | Disposition: A | Discharge status: Home / Self Care | Attending: Obstetrics and Gynecology | Admitting: Obstetrics and Gynecology

## 2024-02-23 DIAGNOSIS — R42 Dizziness and giddiness: Secondary | ICD-10-CM | POA: Diagnosis not present

## 2024-02-23 DIAGNOSIS — Z3A11 11 weeks gestation of pregnancy: Secondary | ICD-10-CM

## 2024-02-23 DIAGNOSIS — O26891 Other specified pregnancy related conditions, first trimester: Secondary | ICD-10-CM

## 2024-02-23 DIAGNOSIS — R102 Pelvic and perineal pain: Secondary | ICD-10-CM | POA: Diagnosis not present

## 2024-02-23 LAB — WET PREP, GENITAL
Clue Cells Wet Prep HPF POC: NONE SEEN
Sperm: NONE SEEN
Trich, Wet Prep: NONE SEEN
WBC, Wet Prep HPF POC: <10 (ref <10)
Yeast Wet Prep HPF POC: NONE SEEN

## 2024-02-23 LAB — URINALYSIS, ROUTINE W REFLEX MICROSCOPIC
Bilirubin Urine: NEGATIVE
Glucose, UA: NEGATIVE mg/dL
Hgb urine dipstick: NEGATIVE
Ketones, ur: NEGATIVE mg/dL
Leukocytes,Ua: NEGATIVE
Nitrite: NEGATIVE
Protein, ur: NEGATIVE mg/dL
Specific Gravity, Urine: 1.004 — ABNORMAL LOW (ref 1.005–1.030)
pH: 6 (ref 5.0–8.0)
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
## 2024-02-23 NOTE — MAU Note (Addendum)
 Pt says she has pelvic pain - started Monday 5/10. Took reg Tyl 2 tabs - only on  Monday - none since .  Now - pain is 9/10 Has lower back pain - started this am - didn't take any meds. 8/10. [facility]- [facility]- called nurse line tonight  at 1030pm. Says dizziness started this am -  less dizzy now . Pt is not driving

## 2024-02-25 LAB — CULTURE, OB URINE: Culture: 40000 — AB

## 2024-02-25 LAB — GC/CHLAMYDIA PROBE AMP (~~LOC~~) NOT AT ARMC
Chlamydia: NEGATIVE
Comment: NEGATIVE
Comment: NORMAL
Neisseria Gonorrhea: NEGATIVE

## 2024-03-04 ENCOUNTER — Other Ambulatory Visit: Payer: Self-pay | Admitting: Obstetrics & Gynecology

## 2024-03-04 DIAGNOSIS — Z3682 Encounter for antenatal screening for nuchal translucency: Secondary | ICD-10-CM

## 2024-03-05 ENCOUNTER — Encounter: Admitting: *Deleted

## 2024-03-05 ENCOUNTER — Encounter: Payer: Self-pay | Admitting: Advanced Practice Midwife

## 2024-03-05 ENCOUNTER — Ambulatory Visit

## 2024-03-05 ENCOUNTER — Ambulatory Visit: Admitting: Advanced Practice Midwife

## 2024-03-05 VITALS — BP 123/84 | HR 87 | Wt 219.0 lb

## 2024-03-05 DIAGNOSIS — Z98891 History of uterine scar from previous surgery: Secondary | ICD-10-CM

## 2024-03-05 DIAGNOSIS — Z6841 Body Mass Index (BMI) 40.0 and over, adult: Secondary | ICD-10-CM | POA: Insufficient documentation

## 2024-03-05 DIAGNOSIS — Z3A13 13 weeks gestation of pregnancy: Secondary | ICD-10-CM | POA: Diagnosis not present

## 2024-03-05 DIAGNOSIS — I10 Essential (primary) hypertension: Secondary | ICD-10-CM

## 2024-03-05 DIAGNOSIS — O34219 Maternal care for unspecified type scar from previous cesarean delivery: Secondary | ICD-10-CM | POA: Diagnosis not present

## 2024-03-05 DIAGNOSIS — Z3682 Encounter for antenatal screening for nuchal translucency: Secondary | ICD-10-CM | POA: Diagnosis not present

## 2024-03-05 DIAGNOSIS — Z363 Encounter for antenatal screening for malformations: Secondary | ICD-10-CM

## 2024-03-05 DIAGNOSIS — Z131 Encounter for screening for diabetes mellitus: Secondary | ICD-10-CM

## 2024-03-05 MED ORDER — ASPIRIN 81 MG PO CHEW: 162.0000 mg | CHEWABLE_TABLET | Freq: Every day | ORAL | 7 refills | Status: DC

## 2024-03-05 MED ORDER — BLOOD PRESSURE MONITOR MISC: 0 refills | Status: DC

## 2024-03-05 NOTE — Progress Notes (Addendum)
 INITIAL OBSTETRICAL VISIT Patient name: Erica Colon MRN [id number]  Date of birth: [birth year] Chief Complaint:   Initial Prenatal Visit  History of Present Illness:   Erica Colon is a 23 y.o. G38P1001 Caucasian female at [redacted]w[redacted]d by LMP c/w u/s at 7.5 weeks with an Estimated Date of Delivery: 09/10/24 being seen today for her initial obstetrical visit.   Patient's last menstrual period was 12/05/2023. Her obstetrical history is significant for 37wk pLTCS for breech and gHTN.   Today she reports no complaints.  Last pap June 2023. Results were: NILM w/ HRHPV not done     03/05/2024    9:29 AM 03/27/2022    8:37 AM 12/21/2021   10:49 AM 11/17/2021   10:18 AM 10/05/2021    3:09 PM  Depression screen PHQ 2/9  Decreased Interest 0 1 0 1 2  Down, Depressed, Hopeless 1 0 0 0 2  PHQ - 2 Score 1 1 0 1 4  Altered sleeping 1 2 2 2 3   Tired, decreased energy 2 2 2 2 3   Change in appetite 2 2 2 2 3   Feeling bad or failure about yourself  0 0 0 1 2  Trouble concentrating 2 0 0 2 2  Moving slowly or fidgety/restless 0 0 0 1 2  Suicidal thoughts 0 0 0 0 0  PHQ-9 Score 8 7 6 11 19   Difficult doing work/chores     Very difficult        03/05/2024    9:29 AM 03/27/2022    8:37 AM 12/21/2021   10:51 AM 11/17/2021   10:21 AM  GAD 7 : Generalized Anxiety Score  Nervous, Anxious, on Edge 1 1 2 1   Control/stop worrying 1 1 1 1   Worry too much - different things 1 0 1 1  Trouble relaxing 1 2 1 1   Restless 0 1 1 0  Easily annoyed or irritable 1 1 1 1   Afraid - awful might happen 0 0 0 0  Total GAD 7 Score 5 6 7 5      Review of Systems:   Pertinent items are noted in HPI Denies cramping/contractions, leakage of fluid, vaginal bleeding, abnormal vaginal discharge w/ itching/odor/irritation, headaches, visual changes, shortness of breath, chest pain, abdominal pain, severe nausea/vomiting, or problems with urination or bowel movements unless otherwise stated above.  Pertinent History  Reviewed:  Reviewed past medical,surgical, social, obstetrical and family history.  Reviewed problem list, medications and allergies. OB History  Gravida Para Term Preterm AB Living  2 1 1  0 0 1  SAB IAB Ectopic Multiple Live Births  0 0 0 0 1    # Outcome Date GA Lbr Len/2nd Weight Sex Type Anes PTL Lv  2 Current           1 Term 05/09/19 [redacted]w[redacted]d   F CS-LTranv Spinal  LIV     Complications: Breech presentation, Gestational hypertension   Physical Assessment:   Vitals:   03/05/24 1012  BP: 123/84  Pulse: 87  Weight: 219 lb (99.3 kg)  Body mass index is 40.06 kg/m.       Physical Examination:  General appearance - well appearing, and in no distress  Mental status - alert, oriented to person, place, and time  Psych:  She has a normal mood and affect  Skin - warm and dry, normal color, no suspicious lesions noted  Chest - effort normal, all lung fields clear to auscultation bilaterally  Heart - normal rate and  regular rhythm  Abdomen - soft, nontender  Extremities:  No swelling or varicosities noted  Pelvic - not indicated  Thin prep pap is not done   TODAY'S NT US  13 wks,measurements c/w dates,bicornuate uterus,anterior placenta,normal ovaries,NT 1.7 mm,NB present,FHR 157 bpm,CRL 69.58 mm   No results found for this or any previous visit (from the past 24 hours).  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    Dispense:  60 tablet    Refill:  7    Supervising Provider:   Keene Pastures [[id number]]    Initial labs obtained Continue prenatal vitamins Reviewed n/v relief measures and warning s/s to report Reviewed recommended weight gain based on pre-gravid BMI Encouraged well-balanced diet Genetic & carrier screening discussed: requests Panorama and NT/IT, declines Horizon  Ultrasound discussed; fetal survey: requested CCNC completed> form faxed if has or is planning to apply for medicaid The nature of [place] [facility] [employer] with multiple MDs and other Advanced Practice Providers was explained to patient; also emphasized that fellows, residents, and students are part of our team. Does not have home bp cuff. Office bp cuff given: no. Rx sent: yes. Check bp weekly, let us  know if consistently >140/90.   Indications for ASA therapy (per uptodate) One of the following: H/O preeclampsia, especially early onset/adverse outcome Yes CHTN Yes   Follow-up: Return for 4wk HROB & 2nd IT: 7wk HROB & anat u/s.   Orders Placed This Encounter  Procedures   Urine Culture   US  OB Comp + 14 Wk   Integrated 1   Hemoglobin A1c   CBC/D/Plt+RPR+Rh+ABO+RubIgG...   [facility] PRENATAL TEST    Jolayne Natter CNM 03/05/2024 4:55 PM

## 2024-03-05 NOTE — Progress Notes (Signed)
 US  13 wks,measurements c/w dates,bicornuate uterus,anterior placenta,normal ovaries,NT 1.7 mm,NB present,FHR 157 bpm,CRL 69.58 mm

## 2024-03-05 NOTE — Patient Instructions (Signed)
 Shayra, thank you for choosing our office today! We appreciate the opportunity to meet your healthcare needs. You may receive a short survey by mail, e-mail, or through [employer]. If you are happy with your care we would appreciate if you could take just a few minutes to complete the survey questions. We read all of your comments and take your feedback very seriously. Thank you again for choosing our office.  [facility] [employer] [facility] Team at [facility]  [facility] at [facility] ([address] [place], [place] [address]) Entrance C, located off of [address] Free 24/7 valet parking   Nausea & Vomiting Have saltine crackers or pretzels by your bed and eat a few bites before you raise your head out of bed in the morning Eat small frequent meals throughout the day instead of large meals Drink plenty of fluids throughout the day to stay hydrated, just don't drink a lot of fluids with your meals.  This can make your stomach fill up faster making you feel sick Do not brush your teeth right after you eat Products with real ginger are good for nausea, like ginger ale and ginger hard candy Make sure it says made with real ginger! Sucking on sour candy like lemon heads is also good for nausea If your prenatal vitamins make you nauseated, take them at night so you will sleep through the nausea Sea Bands If you feel like you need medicine for the nausea & vomiting please let us  know If you are unable to keep any fluids or food down please let us  know   Constipation Drink plenty of fluid, preferably water, throughout the day Eat foods high in fiber such as fruits, vegetables, and grains Exercise, such as walking, is a good way to keep your bowels regular Drink warm fluids, especially warm prune juice, or decaf coffee Eat a 1/2 cup of real oatmeal (not instant), 1/2 cup applesauce, and 1/2-1 cup warm prune juice every day If needed, you may take Colace (docusate sodium ) stool softener  once or twice a day to help keep the stool soft.  If you still are having problems with constipation, you may take Miralax  once daily as needed to help keep your bowels regular.   Home Blood Pressure Monitoring for Patients   Your provider has recommended that you check your blood pressure (BP) at least once a week at home. If you do not have a blood pressure cuff at home, one will be provided for you. Contact your provider if you have not received your monitor within 1 week.   Helpful Tips for Accurate Home Blood Pressure Checks  Don't smoke, exercise, or drink caffeine  30 minutes before checking your BP Use the restroom before checking your BP (a full bladder can raise your pressure) Relax in a comfortable upright chair Feet on the ground Left arm resting comfortably on a flat surface at the level of your heart Legs uncrossed Back supported Sit quietly and don't talk Place the cuff on your bare arm Adjust snuggly, so that only two fingertips can fit between your skin and the top of the cuff Check 2 readings separated by at least one minute Keep a log of your BP readings For a visual, please reference this diagram: http://ccnc.care/bpdiagram  Provider Name: [facility]     Phone: [phone]  Zone 1: ALL CLEAR  Continue to monitor your symptoms:  BP reading is less than 140 (top number) or less than 90 (bottom  number)  No right upper stomach pain No headaches or seeing spots No feeling nauseated or throwing up No swelling in face and hands  Zone 2: CAUTION Call your doctor's office for any of the following:  BP reading is greater than 140 (top number) or greater than 90 (bottom number)  Stomach pain under your ribs in the middle or right side Headaches or seeing spots Feeling nauseated or throwing up Swelling in face and hands  Zone 3: EMERGENCY  Seek immediate medical care if you have any of the following:  BP reading is greater than160 (top number) or greater than  110 (bottom number) Severe headaches not improving with Tylenol  Serious difficulty catching your breath Any worsening symptoms from Zone 2    First Trimester of Pregnancy The first trimester of pregnancy is from week 1 until the end of week 12 (months 1 through 3). A week after a sperm fertilizes an egg, the egg will implant on the wall of the uterus. This embryo will begin to develop into a baby. Genes from you and your partner are forming the baby. The female genes determine whether the baby is a boy or a girl. At 6-8 weeks, the eyes and face are formed, and the heartbeat can be seen on ultrasound. At the end of 12 weeks, all the baby's organs are formed.  Now that you are pregnant, you will want to do everything you can to have a healthy baby. Two of the most important things are to get good prenatal care and to follow your health care provider's instructions. Prenatal care is all the medical care you receive before the baby's birth. This care will help prevent, find, and treat any problems during the pregnancy and childbirth. BODY CHANGES Your body goes through many changes during pregnancy. The changes vary from woman to woman.  You may gain or lose a couple of pounds at first. You may feel sick to your stomach (nauseous) and throw up (vomit). If the vomiting is uncontrollable, call your health care provider. You may tire easily. You may develop headaches that can be relieved by medicines approved by your health care provider. You may urinate more often. Painful urination may mean you have a bladder infection. You may develop heartburn as a result of your pregnancy. You may develop constipation because certain hormones are causing the muscles that push waste through your intestines to slow down. You may develop hemorrhoids or swollen, bulging veins (varicose veins). Your breasts may begin to grow larger and become tender. Your nipples may stick out more, and the tissue that surrounds them  (areola) may become darker. Your gums may bleed and may be sensitive to brushing and flossing. Dark spots or blotches (chloasma, mask of pregnancy) may develop on your face. This will likely fade after the baby is born. Your menstrual periods will stop. You may have a loss of appetite. You may develop cravings for certain kinds of food. You may have changes in your emotions from day to day, such as being excited to be pregnant or being concerned that something may go wrong with the pregnancy and baby. You may have more vivid and strange dreams. You may have changes in your hair. These can include thickening of your hair, rapid growth, and changes in texture. Some women also have hair loss during or after pregnancy, or hair that feels dry or thin. Your hair will most likely return to normal after your baby is born. WHAT TO EXPECT AT YOUR PRENATAL  VISITS During a routine prenatal visit: You will be weighed to make sure you and the baby are growing normally. Your blood pressure will be taken. Your abdomen will be measured to track your baby's growth. The fetal heartbeat will be listened to starting around week 10 or 12 of your pregnancy. Test results from any previous visits will be discussed. Your health care provider may ask you: How you are feeling. If you are feeling the baby move. If you have had any abnormal symptoms, such as leaking fluid, bleeding, severe headaches, or abdominal cramping. If you have any questions. Other tests that may be performed during your first trimester include: Blood tests to find your blood type and to check for the presence of any previous infections. They will also be used to check for low iron levels (anemia) and Rh antibodies. Later in the pregnancy, blood tests for diabetes will be done along with other tests if problems develop. Urine tests to check for infections, diabetes, or protein in the urine. An ultrasound to confirm the proper growth and development  of the baby. An amniocentesis to check for possible genetic problems. Fetal screens for spina bifida and Down syndrome. You may need other tests to make sure you and the baby are doing well. HOME CARE INSTRUCTIONS  Medicines Follow your health care provider's instructions regarding medicine use. Specific medicines may be either safe or unsafe to take during pregnancy. Take your prenatal vitamins as directed. If you develop constipation, try taking a stool softener if your health care provider approves. Diet Eat regular, well-balanced meals. Choose a variety of foods, such as meat or vegetable-based protein, fish, milk and low-fat dairy products, vegetables, fruits, and whole grain breads and cereals. Your health care provider will help you determine the amount of weight gain that is right for you. Avoid raw meat and uncooked cheese. These carry germs that can cause birth defects in the baby. Eating four or five small meals rather than three large meals a day may help relieve nausea and vomiting. If you start to feel nauseous, eating a few soda crackers can be helpful. Drinking liquids between meals instead of during meals also seems to help nausea and vomiting. If you develop constipation, eat more high-fiber foods, such as fresh vegetables or fruit and whole grains. Drink enough fluids to keep your urine clear or pale yellow. Activity and Exercise Exercise only as directed by your health care provider. Exercising will help you: Control your weight. Stay in shape. Be prepared for labor and delivery. Experiencing pain or cramping in the lower abdomen or low back is a good sign that you should stop exercising. Check with your health care provider before continuing normal exercises. Try to avoid standing for long periods of time. Move your legs often if you must stand in one place for a long time. Avoid heavy lifting. Wear low-heeled shoes, and practice good posture. You may continue to have sex  unless your health care provider directs you otherwise. Relief of Pain or Discomfort Wear a good support bra for breast tenderness.   Take warm sitz baths to soothe any pain or discomfort caused by hemorrhoids. Use hemorrhoid cream if your health care provider approves.   Rest with your legs elevated if you have leg cramps or low back pain. If you develop varicose veins in your legs, wear support hose. Elevate your feet for 15 minutes, 3-4 times a day. Limit salt in your diet. Prenatal Care Schedule your prenatal visits by the  twelfth week of pregnancy. They are usually scheduled monthly at first, then more often in the last 2 months before delivery. Write down your questions. Take them to your prenatal visits. Keep all your prenatal visits as directed by your health care provider. Safety Wear your seat belt at all times when driving. Make a list of emergency phone numbers, including numbers for family, friends, the hospital, and police and fire departments. General Tips Ask your health care provider for a referral to a local prenatal education class. Begin classes no later than at the beginning of month 6 of your pregnancy. Ask for help if you have counseling or nutritional needs during pregnancy. Your health care provider can offer advice or refer you to specialists for help with various needs. Do not use hot tubs, steam rooms, or saunas. Do not douche or use tampons or scented sanitary pads. Do not cross your legs for long periods of time. Avoid cat litter boxes and soil used by cats. These carry germs that can cause birth defects in the baby and possibly loss of the fetus by miscarriage or stillbirth. Avoid all smoking, herbs, alcohol, and medicines not prescribed by your health care provider. Chemicals in these affect the formation and growth of the baby. Schedule a dentist appointment. At home, brush your teeth with a soft toothbrush and be gentle when you floss. SEEK MEDICAL CARE IF:   You have dizziness. You have mild pelvic cramps, pelvic pressure, or nagging pain in the abdominal area. You have persistent nausea, vomiting, or diarrhea. You have a bad smelling vaginal discharge. You have pain with urination. You notice increased swelling in your face, hands, legs, or ankles. SEEK IMMEDIATE MEDICAL CARE IF:  You have a fever. You are leaking fluid from your vagina. You have spotting or bleeding from your vagina. You have severe abdominal cramping or pain. You have rapid weight gain or loss. You vomit blood or material that looks like coffee grounds. You are exposed to [place] measles and have never had them. You are exposed to fifth disease or chickenpox. You develop a severe headache. You have shortness of breath. You have any kind of trauma, such as from a fall or a car accident. Document Released: 09/19/2001 Document Revised: 02/09/2014 Document Reviewed: 08/05/2013 [facility] Patient Information 2015 [place], [place]. This information is not intended to replace advice given to you by your health care provider. Make sure you discuss any questions you have with your health care provider.

## 2024-03-06 LAB — INTEGRATED 1

## 2024-03-07 LAB — CBC/D/PLT+RPR+RH+ABO+RUBIGG...
Antibody Screen: NEGATIVE
Basophils Absolute: 0 10*3/uL (ref 0.0–0.2)
Basos: 0 %
EOS (ABSOLUTE): 0.1 10*3/uL (ref 0.0–0.4)
Eos: 0 %
HCV Ab: NONREACTIVE
HIV Screen 4th Generation wRfx: NONREACTIVE
Hematocrit: 47.5 % — ABNORMAL HIGH (ref 34.0–46.6)
Hemoglobin: 15.5 g/dL (ref 11.1–15.9)
Hepatitis B Surface Ag: NEGATIVE
Immature Grans (Abs): 0 10*3/uL (ref 0.0–0.1)
Immature Granulocytes: 0 %
Lymphocytes Absolute: 3.5 10*3/uL — ABNORMAL HIGH (ref 0.7–3.1)
Lymphs: 30 %
MCH: 28.1 pg (ref 26.6–33.0)
MCHC: 32.6 g/dL (ref 31.5–35.7)
MCV: 86 fL (ref 79–97)
Monocytes Absolute: 0.7 10*3/uL (ref 0.1–0.9)
Monocytes: 6 %
Neutrophils Absolute: 7.4 10*3/uL — ABNORMAL HIGH (ref 1.4–7.0)
Neutrophils: 64 %
Platelets: 399 10*3/uL (ref 150–450)
RBC: 5.51 x10E6/uL — ABNORMAL HIGH (ref 3.77–5.28)
RDW: 14.5 % (ref 11.7–15.4)
RPR Ser Ql: NONREACTIVE
Rh Factor: NEGATIVE
Rubella Antibodies, IGG: 2.73 {index} (ref >0.99)
WBC: 11.7 10*3/uL — ABNORMAL HIGH (ref 3.4–10.8)

## 2024-03-07 LAB — INTEGRATED 1
Crown Rump Length: 69.6 mm
Gest. Age on Collection Date: 13 wk
PAPP-A Value: 574.7 ng/mL
Race: 1
Sonographer ID#: 23.4 a
Sonographer ID#: 309760
Weight: 1.7 mm
Weight: 219 [lb_av]

## 2024-03-07 LAB — HEMOGLOBIN A1C
Est. average glucose Bld gHb Est-mCnc: 100 mg/dL
Hgb A1c MFr Bld: 5.1 % (ref 4.8–5.6)

## 2024-03-07 LAB — URINE CULTURE

## 2024-03-07 LAB — HCV INTERPRETATION

## 2024-03-08 ENCOUNTER — Encounter: Payer: Self-pay | Admitting: Advanced Practice Midwife

## 2024-03-08 DIAGNOSIS — Z6791 Unspecified blood type, Rh negative: Secondary | ICD-10-CM | POA: Insufficient documentation

## 2024-03-08 DIAGNOSIS — O0990 Supervision of high risk pregnancy, unspecified, unspecified trimester: Secondary | ICD-10-CM | POA: Insufficient documentation

## 2024-03-08 DIAGNOSIS — O26899 Other specified pregnancy related conditions, unspecified trimester: Secondary | ICD-10-CM | POA: Insufficient documentation

## 2024-03-08 LAB — SPECIMEN STATUS REPORT

## 2024-03-10 ENCOUNTER — Ambulatory Visit

## 2024-03-10 NOTE — Progress Notes (Addendum)
   NURSE VISIT- [facility]  SUBJECTIVE:  Erica Colon is a 23 y.o. G51P1001 female here for Panorama NIPT . She is [redacted]w[redacted]d pregnant.   OBJECTIVE:  Appears well, in no apparent distress  Blood work drawn from left [facility] without difficulty. 1 attempt(s).   ASSESSMENT: Pregnancy [redacted]w[redacted]d Panorama NIPT  PLAN: [facility] portal information given and instructed patient how to access results Follow-up as scheduled  Holman Bonsignore E Bellarose Burtt  03/10/2024 12:15 PM

## 2024-03-13 LAB — COMPREHENSIVE METABOLIC PANEL WITH GFR
ALT: 20 IU/L (ref 0–32)
AST: 16 IU/L (ref 0–40)
Albumin: 4.3 g/dL (ref 4.0–5.0)
Alkaline Phosphatase: 80 IU/L (ref 44–121)
BUN/Creatinine Ratio: 15 (ref 9–23)
BUN: 9 mg/dL (ref 6–20)
Bilirubin Total: <0.2 mg/dL (ref 0.0–1.2)
CO2: 15 mmol/L — ABNORMAL LOW (ref 20–29)
Calcium: 9.9 mg/dL (ref 8.7–10.2)
Chloride: 99 mmol/L (ref 96–106)
Creatinine, Ser: 0.6 mg/dL (ref 0.57–1.00)
Globulin, Total: 3.2 g/dL (ref 1.5–4.5)
Glucose: 71 mg/dL (ref 70–99)
Potassium: 4.7 mmol/L (ref 3.5–5.2)
Sodium: 136 mmol/L (ref 134–144)
Total Protein: 7.5 g/dL (ref 6.0–8.5)
eGFR: 130 mL/min/{1.73_m2} (ref >59)

## 2024-03-13 LAB — SPECIMEN STATUS REPORT

## 2024-03-15 LAB — PANORAMA PRENATAL TEST FULL PANEL:PANORAMA TEST PLUS 5 ADDITIONAL MICRODELETIONS

## 2024-03-18 ENCOUNTER — Telehealth: Payer: Self-pay | Admitting: Women's Health

## 2024-03-18 NOTE — Telephone Encounter (Signed)
 Pt has questions about testing and would like a call back from the office.

## 2024-03-19 ENCOUNTER — Ambulatory Visit: Payer: Self-pay | Admitting: Advanced Practice Midwife

## 2024-03-20 ENCOUNTER — Other Ambulatory Visit

## 2024-03-20 DIAGNOSIS — Z3A15 15 weeks gestation of pregnancy: Secondary | ICD-10-CM

## 2024-03-22 LAB — INTEGRATED 2
AFP MoM: 1.39
Alpha-Fetoprotein: 28.7 ng/mL
Crown Rump Length: 69.6 mm
DIA MoM: 0.55
DIA Value: 75.1 pg/mL
Estriol, Unconjugated: 0.83 ng/mL
Gest. Age on Collection Date: 13 wk
Gestational Age: 15.1 wk
Maternal Age at EDD: 23.4 a
Nuchal Translucency (NT): 1.7 mm
Nuchal Translucency MoM: 0.9
Number of Fetuses: 1
PAPP-A MoM: 0.95
PAPP-A Value: 574.7 ng/mL
Sonographer ID#: 309760
Test Results:: NEGATIVE
Weight: 219 [lb_av]
Weight: 219 [lb_av]
hCG MoM: 0.4
hCG Value: 14.7 [IU]/mL
uE3 MoM: 1.08

## 2024-03-24 ENCOUNTER — Ambulatory Visit: Payer: Self-pay | Admitting: Women's Health

## 2024-03-24 DIAGNOSIS — O285 Abnormal chromosomal and genetic finding on antenatal screening of mother: Secondary | ICD-10-CM

## 2024-03-24 DIAGNOSIS — O0992 Supervision of high risk pregnancy, unspecified, second trimester: Secondary | ICD-10-CM

## 2024-03-26 LAB — PANORAMA PRENATAL TEST FULL PANEL:PANORAMA TEST PLUS 5 ADDITIONAL MICRODELETIONS
FETAL FRACTION: 4.4
MONOSOMY X RESULT TEXT: HIGH — AB
REPORT SUMMARY: HIGH — AB

## 2024-03-27 ENCOUNTER — Encounter: Payer: Self-pay | Admitting: Women's Health

## 2024-03-27 DIAGNOSIS — O285 Abnormal chromosomal and genetic finding on antenatal screening of mother: Secondary | ICD-10-CM | POA: Insufficient documentation

## 2024-04-02 ENCOUNTER — Encounter: Admitting: Advanced Practice Midwife

## 2024-04-07 ENCOUNTER — Other Ambulatory Visit: Payer: Self-pay | Admitting: Women's Health

## 2024-04-07 DIAGNOSIS — O0992 Supervision of high risk pregnancy, unspecified, second trimester: Secondary | ICD-10-CM

## 2024-04-07 DIAGNOSIS — O285 Abnormal chromosomal and genetic finding on antenatal screening of mother: Secondary | ICD-10-CM

## 2024-04-14 ENCOUNTER — Encounter: Admitting: Obstetrics & Gynecology

## 2024-04-15 ENCOUNTER — Ambulatory Visit

## 2024-04-21 ENCOUNTER — Encounter: Admitting: Obstetrics & Gynecology

## 2024-04-21 ENCOUNTER — Encounter: Payer: Self-pay | Admitting: Obstetrics & Gynecology

## 2024-04-23 ENCOUNTER — Encounter: Admitting: Women's Health

## 2024-04-23 ENCOUNTER — Other Ambulatory Visit

## 2024-04-24 ENCOUNTER — Ambulatory Visit (HOSPITAL_BASED_OUTPATIENT_CLINIC_OR_DEPARTMENT_OTHER): Admitting: Maternal & Fetal Medicine

## 2024-04-24 ENCOUNTER — Ambulatory Visit

## 2024-04-24 ENCOUNTER — Ambulatory Visit: Attending: Obstetrics and Gynecology

## 2024-04-24 ENCOUNTER — Encounter: Admitting: Women's Health

## 2024-04-24 ENCOUNTER — Other Ambulatory Visit: Payer: Self-pay | Admitting: Women's Health

## 2024-04-24 ENCOUNTER — Other Ambulatory Visit: Payer: Self-pay | Admitting: *Deleted

## 2024-04-24 ENCOUNTER — Encounter: Admitting: Obstetrics & Gynecology

## 2024-04-24 VITALS — BP 136/84 | HR 104

## 2024-04-24 VITALS — BP 134/93 | HR 80

## 2024-04-24 DIAGNOSIS — O99212 Obesity complicating pregnancy, second trimester: Secondary | ICD-10-CM | POA: Diagnosis present

## 2024-04-24 DIAGNOSIS — O285 Abnormal chromosomal and genetic finding on antenatal screening of mother: Secondary | ICD-10-CM

## 2024-04-24 DIAGNOSIS — O133 Gestational [pregnancy-induced] hypertension without significant proteinuria, third trimester: Secondary | ICD-10-CM

## 2024-04-24 DIAGNOSIS — Z6791 Unspecified blood type, Rh negative: Secondary | ICD-10-CM

## 2024-04-24 DIAGNOSIS — O0992 Supervision of high risk pregnancy, unspecified, second trimester: Secondary | ICD-10-CM

## 2024-04-24 DIAGNOSIS — Z3A20 20 weeks gestation of pregnancy: Secondary | ICD-10-CM | POA: Insufficient documentation

## 2024-04-24 DIAGNOSIS — Z6841 Body Mass Index (BMI) 40.0 and over, adult: Secondary | ICD-10-CM

## 2024-04-24 DIAGNOSIS — E669 Obesity, unspecified: Secondary | ICD-10-CM

## 2024-04-24 DIAGNOSIS — Q969 Turner's syndrome, unspecified: Secondary | ICD-10-CM

## 2024-04-24 DIAGNOSIS — Z98891 History of uterine scar from previous surgery: Secondary | ICD-10-CM

## 2024-04-24 DIAGNOSIS — O10012 Pre-existing essential hypertension complicating pregnancy, second trimester: Secondary | ICD-10-CM

## 2024-04-24 DIAGNOSIS — Z363 Encounter for antenatal screening for malformations: Secondary | ICD-10-CM | POA: Diagnosis not present

## 2024-04-24 DIAGNOSIS — O26899 Other specified pregnancy related conditions, unspecified trimester: Secondary | ICD-10-CM

## 2024-04-24 DIAGNOSIS — I10 Essential (primary) hypertension: Secondary | ICD-10-CM

## 2024-04-24 MED ORDER — RHO D IMMUNE GLOBULIN 1500 UNIT/2ML IJ SOSY
300.0000 ug | PREFILLED_SYRINGE | Freq: Once | INTRAMUSCULAR | Status: AC
  Administered 2024-04-24: 300 ug via INTRAMUSCULAR

## 2024-04-24 NOTE — Progress Notes (Signed)
 [facility] at [facility] [address], [address] Phone:  [phone]   Fax:  [phone]      In-Person Genetic Counseling Clinic Note:   I spoke with 23 y.o. Erica Colon today to discuss her NIPS results. She was referred by Kizzie Suzen SAUNDERS, CNM. She was accompanied by FOB Selinda.   Pregnancy/Family History:    A detailed pregnancy/family history was not taken today. G2P1001. EGA: [redacted]w[redacted]d by LMP. EDD: 09/10/2024. They have one healthy daughter. Denies major personal health concerns. Denies bleeding, infections, and fevers in this pregnancy. Denies using tobacco, alcohol, or street drugs in this pregnancy. Patient reports her mother was born with one hip. FOB reports his mother has short stature (4'1) and has an irregular heartbeat. No other information known. We discussed these can be genetic or multifactorial and recurrence risk is difficult to estimate with no known medical reports. FOB is encouraged to share his family history with his PCP for appropriate management. Family history not remarkable for consanguinity, individuals with birth defects, intellectual disability, autism spectrum disorder, multiple spontaneous abortions, still births, or unexplained neonatal death.   Turner syndrome on NIPS:  Takiesha previously completed noninvasive prenatal screening (NIPS). This screen analyzes cell-free DNA originating from the placenta that is found in the maternal blood circulation during pregnancy. This test can provide information regarding the presence or absence of extra placental DNA for chromosomes 13, 18, and 21 as well as the sex chromosomes. [name] was referred for genetic counseling as results from NIPS indicate an increased risk for monosomy X (Turner syndrome). The laboratory reports a positive predictive value (PPV) of 68%. The PPV calculator offered by the [employer] and the [employer] estimated PPV for Turner syndrome in the current pregnancy to be 43%. Thus, there is a 43-68% chance that this could be a true positive result. Of note, the NIPS result returned low risk for trisomy 76, trisomy 49, trisomy 63, triploidy, and 22q11.2 deletion syndrome. Please see report for details. There are many genetic conditions that cannot be detected by NIPS.  Erica Colon was counseled that there are several possible explanations for her high risk NIPS result including a true positive, mosaic Turner syndrome, and confined placental mosaicism. Lastly, this could be a false positive result.  Diagnostic Testing: We discussed the next available options for the couple. Erica Colon was informed that diagnostic testing through amniocentesis would be the only way to definitively determine if the fetus has Turner syndrome prenatally. Erica Colon was also made aware that she has the option of continuing with standard ultrasounds and pursuing genetic testing postnatally. The technical aspects of prenatal diagnostic testing through amniocentesis as well as the benefits, risks, and limitations including the 1 in 500 risk for miscarriage were reviewed. We discussed that Turner syndrome is associated with higher rates of CPM. Studies have shown that high risk NIPS results for Turner syndrome are due to CPM in up to 59% of cases.   Diagnostic testing performed either prenatally or postnatally will provide a karyotype that will help inform us  of the type of full vs. mosaic Turner syndrome, if it returns positive, as well as recurrence risks for future pregnancies. They shared that they are unlikely to terminate the pregnancy if the diagnostic test results return as positive. The couple elected to pursue amniocentesis. We will order karyotype with reflex to chromosomal microarray, AF-AFP, and maternal cell contamination.  Turner Syndrome: We reviewed that around  50% of cases of Turner syndrome are full monosomy X, affecting all  cells of the affected individual. The other half of cases are mosaic Turner syndrome, in which some of the body cells have monosomy X while other body cells have typical chromosomes, and other structural rearrangements involving the X chromosome. We discussed that it would not be possible to tell which features an individual with mosaic Turner syndrome may have, as it is impossible to assess which specific cells and tissues in the body have Turner syndrome. Individuals who are mosaic for Turner syndrome are typically more mildly affected than individuals with full Turner syndrome, though this is not always the case. We reviewed that Turner syndrome most commonly occurs by chance due to an error in chromosomal division during the formation of egg and sperm cells in a process called nondisjunction. Rarely, Turner syndrome may be caused by a partial deletion of an X chromosome, which can be inherited from a parent. Since Turner syndrome is most often not inherited, recurrence is rare; however, cases of recurrence have been reported. The risk for Turner syndrome does not increase with maternal age unlike some other chromosomal aneuploidies.  Fetuses with Turner syndrome may present prenatally with increased nuchal translucency, cystic hygroma, hydrops, cardiac defects, growth restriction, and renal anomalies on ultrasound. Approximately 70% of fetuses with Turner syndrome will have at least one finding on ultrasound. We also discussed that fetuses affected with Turner syndrome are at an increased risk for miscarriage independent of any procedures performed. There is up to a 99% risk for miscarriage in pregnancies affected with Turner syndrome. The risk of miscarriage is highest in the first trimester. We discussed that the risk for miscarriage may be lower for mosaic Turner syndrome than for full Turner syndrome. Moreover, because of the increased risk of heart defects with Turner syndrome, Erica Colon was referred for a  fetal echocardiogram.   We briefly reviewed the symptoms associated with Turner syndrome. Females born with Turner syndrome typically have short stature and early ovarian failure resulting in infertility. Affected individuals may also present with webbed necks, broad chests, cardiac defects, and renal abnormalities. Affected females typically have normal intelligence, but may have developmental delays, nonverbal learning disabilities, and behavioral problems. Symptoms vary widely among affected individuals.  Resources: [employer]. The patient elected to be connected with a mentor through the [facility]. [facility]. Declined [facility] referral for counseling.  Turner Syndrome Foundation.  Carrier Screening Offered:  We discussed and offered carrier screening per the ACOG Committee Opinion 691 as well as expanded carrier screening for autosomal recessive and X-linked conditions. The technical aspects, benefits, risks, and limitations of carrier screening were discussed. We reviewed that if the patient is found to be a carrier for a genetic condition, carrier screening is available for the partner. If both are found to be carriers for the same condition, there is a 25% chance of the pregnancy to be affected.  The couple will inform us  if the wish to proceed with carrier screening.  Newborn Screening. The [facility]  Newborn Screening (NBS) program will screen all newborn babies for cystic fibrosis, spinal muscular atrophy, hemoglobinopathies, and numerous other conditions.  Plan of Care:   Amniocentesis performed. Karyotype with reflex to microarray and AF-AFP ordered through [facility]. Maternal cell contamination drawn. Referral for fetal echocardiogram is recommended. Referral to [employer]. Declined [facility] referral. Added to [facility] list.   Informed consent was obtained. All questions were answered.   65 minutes were spent on the date  of the encounter in service to the patient  including preparation, face-to-face consultation, discussion of test reports and available next steps, pedigree construction, genetic risk assessment, documentation, and care coordination.    Thank you for sharing in the care of Kennie with us .  Please do not hesitate to contact us  at [phone] if you have any questions.   Lauraine Bodily, MS, [facility] Certified Genetic Counselor   Genetic counseling student involved in appointment: No.
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
## 2024-04-25 ENCOUNTER — Encounter (HOSPITAL_COMMUNITY): Payer: Self-pay | Admitting: Obstetrics and Gynecology

## 2024-04-25 ENCOUNTER — Inpatient Hospital Stay (HOSPITAL_COMMUNITY)
Admission: AD | Admit: 2024-04-25 | Discharge: 2024-04-25 | Disposition: A | Discharge status: Home / Self Care | Attending: Obstetrics & Gynecology | Admitting: Obstetrics & Gynecology

## 2024-04-25 DIAGNOSIS — Z3A20 20 weeks gestation of pregnancy: Secondary | ICD-10-CM | POA: Diagnosis not present

## 2024-04-25 DIAGNOSIS — Z7982 Long term (current) use of aspirin: Secondary | ICD-10-CM | POA: Insufficient documentation

## 2024-04-25 DIAGNOSIS — Z3492 Encounter for supervision of normal pregnancy, unspecified, second trimester: Secondary | ICD-10-CM

## 2024-04-25 DIAGNOSIS — O26892 Other specified pregnancy related conditions, second trimester: Secondary | ICD-10-CM | POA: Diagnosis not present

## 2024-04-25 DIAGNOSIS — R109 Unspecified abdominal pain: Secondary | ICD-10-CM | POA: Diagnosis not present

## 2024-04-25 DIAGNOSIS — O10912 Unspecified pre-existing hypertension complicating pregnancy, second trimester: Secondary | ICD-10-CM | POA: Diagnosis not present

## 2024-04-25 DIAGNOSIS — O368120 Decreased fetal movements, second trimester, not applicable or unspecified: Secondary | ICD-10-CM | POA: Diagnosis present

## 2024-04-25 LAB — WET PREP, GENITAL
Clue Cells Wet Prep HPF POC: NONE SEEN
Sperm: NONE SEEN
Trich, Wet Prep: NONE SEEN
WBC, Wet Prep HPF POC: <10 (ref <10)
Yeast Wet Prep HPF POC: NONE SEEN

## 2024-04-25 LAB — RUPTURE OF MEMBRANE (ROM)PLUS: Rom Plus: NEGATIVE

## 2024-04-25 MED ORDER — CYCLOBENZAPRINE HCL 5 MG PO TABS
10.0000 mg | ORAL_TABLET | Freq: Once | ORAL | Status: AC
  Administered 2024-04-25: 10 mg via ORAL
  Filled 2024-04-25: qty 2

## 2024-04-25 MED ORDER — CYCLOBENZAPRINE HCL 10 MG PO TABS: 10.0000 mg | ORAL_TABLET | Freq: Two times a day (BID) | ORAL | 0 refills | Status: DC | PRN

## 2024-04-25 MED ORDER — FAMOTIDINE 20 MG PO TABS
20.0000 mg | ORAL_TABLET | Freq: Once | ORAL | Status: AC
  Administered 2024-04-25: 20 mg via ORAL
  Filled 2024-04-25: qty 1

## 2024-04-25 MED ORDER — ACETAMINOPHEN 500 MG PO TABS
1000.0000 mg | ORAL_TABLET | Freq: Once | ORAL | Status: AC
  Administered 2024-04-25: 1000 mg via ORAL
  Filled 2024-04-25: qty 2

## 2024-04-25 NOTE — MAU Note (Addendum)
 Erica Colon is a 23 y.o. at [redacted]w[redacted]d here in MAU reporting: had amniocentesis  yesterday.  Was told if she had pain or leaking to come in. Has had gushes of fluid. Has a pad on.  Has had really bad upper abd pain. Hasn't felt the baby move in about 5 hrs. No bleeding   Onset of complaint: 1000- gush of fluid Pain score: 8 getting worse Vitals:   04/25/24 1651  BP: (!) 137/91  Pulse: 93  Resp: 16  Temp: 98.4 F (36.9 C)  SpO2: 100%     FHT:150 Lab orders placed from triage:
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
## 2024-04-27 ENCOUNTER — Encounter: Payer: Self-pay | Admitting: Obstetrics & Gynecology

## 2024-04-28 ENCOUNTER — Encounter: Admitting: Obstetrics & Gynecology

## 2024-04-28 LAB — GC/CHLAMYDIA PROBE AMP (~~LOC~~) NOT AT ARMC
Chlamydia: NEGATIVE
Comment: NEGATIVE
Comment: NORMAL
Neisseria Gonorrhea: NEGATIVE

## 2024-05-01 ENCOUNTER — Encounter: Payer: Self-pay | Admitting: Obstetrics & Gynecology

## 2024-05-01 ENCOUNTER — Ambulatory Visit: Admitting: Obstetrics & Gynecology

## 2024-05-01 VITALS — BP 136/86 | HR 96 | Wt 221.0 lb

## 2024-05-01 DIAGNOSIS — O10912 Unspecified pre-existing hypertension complicating pregnancy, second trimester: Secondary | ICD-10-CM

## 2024-05-01 DIAGNOSIS — O26892 Other specified pregnancy related conditions, second trimester: Secondary | ICD-10-CM | POA: Diagnosis not present

## 2024-05-01 DIAGNOSIS — Z6791 Unspecified blood type, Rh negative: Secondary | ICD-10-CM

## 2024-05-01 DIAGNOSIS — O285 Abnormal chromosomal and genetic finding on antenatal screening of mother: Secondary | ICD-10-CM

## 2024-05-01 DIAGNOSIS — Z3A21 21 weeks gestation of pregnancy: Secondary | ICD-10-CM

## 2024-05-01 DIAGNOSIS — O0992 Supervision of high risk pregnancy, unspecified, second trimester: Secondary | ICD-10-CM

## 2024-05-01 DIAGNOSIS — O10919 Unspecified pre-existing hypertension complicating pregnancy, unspecified trimester: Secondary | ICD-10-CM

## 2024-05-01 LAB — POCT URINALYSIS DIPSTICK OB
Blood, UA: NEGATIVE
Glucose, UA: NEGATIVE
Ketones, UA: NEGATIVE
Leukocytes, UA: NEGATIVE
Nitrite, UA: NEGATIVE

## 2024-05-01 MED ORDER — BLOOD PRESSURE MONITOR MISC: 0 refills | Status: AC

## 2024-05-01 NOTE — Progress Notes (Signed)
 HIGH-RISK PREGNANCY VISIT Patient name: Erica Colon MRN [id number]  Date of birth: [birth year] Chief Complaint:   High Risk Gestation  History of Present Illness:   Erica Colon is a 23 y.o. G38P1001 female at [redacted]w[redacted]d with an Estimated Date of Delivery: 09/10/24 being seen today for ongoing management of a high-risk pregnancy complicated by:  -concern for monosomy x-amniocentesis pending -chronic HTN Pt notes that she did not yet receive a blood pressure cuff - Prior C-section for breech  Today she reports no complaints.   Contractions: Not present. Vag. Bleeding: None.  Movement: Present. denies leaking of fluid.      03/05/2024    9:29 AM 03/27/2022    8:37 AM 12/21/2021   10:49 AM 11/17/2021   10:18 AM 10/05/2021    3:09 PM  Depression screen PHQ 2/9  Decreased Interest 0 1 0 1 2  Down, Depressed, Hopeless 1 0 0 0 2  PHQ - 2 Score 1 1 0 1 4  Altered sleeping 1 2 2 2 3   Tired, decreased energy 2 2 2 2 3   Change in appetite 2 2 2 2 3   Feeling bad or failure about yourself  0 0 0 1 2  Trouble concentrating 2 0 0 2 2  Moving slowly or fidgety/restless 0 0 0 1 2  Suicidal thoughts 0 0 0 0 0  PHQ-9 Score 8 7 6 11 19   Difficult doing work/chores     Very difficult     Current Outpatient Medications  Medication Instructions   aspirin  162 mg, Oral, Daily   Blood Pressure Monitor MISC For regular home bp monitoring during pregnancy   cyclobenzaprine  (FLEXERIL ) 10 mg, Oral, 2 times daily PRN   ondansetron  (ZOFRAN ) 4 mg, Oral, Every 8 hours PRN   Prenatal Vit-Fe Fumarate-FA (PRENATAL VITAMIN PO) Take by mouth.     Review of Systems:   Pertinent items are noted in HPI Denies abnormal vaginal discharge w/ itching/odor/irritation, headaches, visual changes, shortness of breath, chest pain, abdominal pain, severe nausea/vomiting, or problems with urination or bowel movements unless otherwise stated above. Pertinent History Reviewed:  Reviewed past medical,surgical, social,  obstetrical and family history.  Reviewed problem list, medications and allergies. Physical Assessment:   Vitals:   05/01/24 1101 05/01/24 1119  BP: 139/85 136/86  Pulse: 96 96  Weight: 221 lb (100.2 kg)   Body mass index is 40.42 kg/m.           Physical Examination:   General appearance: alert, well appearing, and in no distress  Mental status: normal mood, behavior, speech, dress, motor activity, and thought processes  Skin: warm & dry   Extremities: Edema: Trace    Cardiovascular: normal heart rate noted  Respiratory: normal respiratory effort, no distress  Abdomen: gravid, soft, non-tender  Pelvic: Cervical exam deferred         Fetal Status: Fetal Heart Rate (bpm): 150   Movement: Present    Fetal Surveillance Testing today: doppler   Chaperone: N/A    Results for orders placed or performed in visit on 05/01/24 (from the past 24 hours)  POC Urinalysis Dipstick OB   Collection Time: 05/01/24 11:04 AM  Result Value Ref Range   Color, UA     Clarity, UA     Glucose, UA Negative Negative   Bilirubin, UA     Ketones, UA neg    Spec Grav, UA     Blood, UA neg    pH, UA  POC,PROTEIN,UA Trace Negative, Trace, Small (1+), Moderate (2+), Large (3+), 4+   Urobilinogen, UA     Nitrite, UA neg    Leukocytes, UA Negative Negative   Appearance     Odor       Assessment & Plan:  High-risk pregnancy: G2P1001 at [redacted]w[redacted]d with an Estimated Date of Delivery: 09/10/24   1. Supervision of high risk pregnancy in second trimester (Primary)  - POC Urinalysis Dipstick OB - Protein / creatinine ratio, urine  2. Chronic hypertension during pregnancy -baseline PC ratio today -BP stable -pt does NOT have cuff- order re-submmited, []  if she does not receive by next visit we will plan to give cuff from office  4. Rh negative state in antepartum period   5. Abnormal chromosomal and genetic finding on antenatal screening of mother []  amniocentesis pending -f/u US  with MFM  scheduled  6. Prior C-section - We discussed her history of c-section. Her previous c-section was due to  fetal malpresentation.  She has a history of  no prior successful vaginal deliveries - Briefly discussed risk benefits of repeat C-section versus trial of vaginal delivery -pt given handout regarding TOLAC  []  plan to complete form for trial of labor at next visit if desired  Meds: No orders of the defined types were placed in this encounter.   Labs/procedures today: doppler  Treatment Plan: Routine OB care and as outlined above  Reviewed: Preterm labor symptoms and general obstetric precautions including but not limited to vaginal bleeding, contractions, leaking of fluid and fetal movement were reviewed in detail with the patient.  All questions were answered.  Follow-up: Return in about 4 weeks (around 05/29/2024) for HROB visit.   Future Appointments  Date Time Provider Department Center  05/15/2024  2:00 PM [facility] PROVIDER 1 [facility]  05/15/2024  2:15 PM [facility] US6 [facility]  05/22/2024 11:30 AM [facility] US  [facility] None  05/22/2024  1:30 PM Jayne Vonn DEL, MD [facility]  05/29/2024 10:10 AM Jayne Vonn DEL, MD [facility]  06/19/2024 10:15 AM [facility] PROVIDER 1 [facility]  06/19/2024 10:30 AM [facility] US4 [facility]  06/19/2024  3:00 PM [facility] US  [facility] None  06/19/2024  3:50 PM Marilynn Nest, DO [facility]  07/17/2024 10:15 AM [facility] PROVIDER 1 [facility]  07/17/2024 10:30 AM [facility] US2 [facility]  07/17/2024  3:00 PM [facility] US  [facility] None  07/21/2024  3:30 PM [facility] NURSE [facility]  07/24/2024  3:00 PM [facility] US  [facility] None  07/28/2024  3:30 PM [facility] NURSE [facility]  07/31/2024  3:00 PM [facility] US  [facility] None  08/04/2024  3:30 PM [facility] NURSE [facility]  08/07/2024  3:00 PM [facility] US  [facility] None  08/11/2024  3:30 PM [facility] NURSE [facility]  08/14/2024  2:15 PM  [facility] US  [facility] None  08/18/2024  3:30 PM [facility] NURSE [facility]  08/21/2024  3:00 PM [facility] US  [facility] None  08/25/2024  3:30 PM [facility] NURSE [facility]  08/28/2024  3:00 PM [facility] US  [facility] None  09/01/2024  3:30 PM [facility] NURSE [facility]  09/08/2024  3:30 PM [facility] NURSE [facility]    Orders Placed This Encounter  Procedures   Protein / creatinine ratio, urine   POC Urinalysis Dipstick OB    Nest Marilynn, DO Attending Obstetrician & Gynecologist, [facility] [employer], [facility]

## 2024-05-03 LAB — PROTEIN / CREATININE RATIO, URINE
Creatinine, Urine: 134.1 mg/dL
Protein, Ur: 18.8 mg/dL
Protein/Creat Ratio: 140 mg/g{creat} (ref 0–200)

## 2024-05-05 ENCOUNTER — Ambulatory Visit: Payer: Self-pay | Admitting: Obstetrics & Gynecology

## 2024-05-08 LAB — MCC TRACKING

## 2024-05-09 ENCOUNTER — Telehealth: Payer: Self-pay

## 2024-05-09 NOTE — Telephone Encounter (Signed)
 Spoke with the patient to return initial results from her recent amniocentesis procedure. Karyotype was 32, XX. No aneuploidy detected. Microarray is pending. AF-AFP wnl.  Lauraine Bodily, MS, [facility] Certified Genetic Counselor [facility] [phone]

## 2024-05-15 ENCOUNTER — Encounter: Payer: Self-pay | Admitting: Obstetrics & Gynecology

## 2024-05-15 ENCOUNTER — Other Ambulatory Visit: Payer: Self-pay | Admitting: Maternal & Fetal Medicine

## 2024-05-15 ENCOUNTER — Ambulatory Visit (HOSPITAL_BASED_OUTPATIENT_CLINIC_OR_DEPARTMENT_OTHER)

## 2024-05-15 ENCOUNTER — Ambulatory Visit: Attending: Obstetrics and Gynecology | Admitting: Obstetrics and Gynecology

## 2024-05-15 VITALS — BP 149/93 | HR 82

## 2024-05-15 DIAGNOSIS — O285 Abnormal chromosomal and genetic finding on antenatal screening of mother: Secondary | ICD-10-CM

## 2024-05-15 DIAGNOSIS — E669 Obesity, unspecified: Secondary | ICD-10-CM

## 2024-05-15 DIAGNOSIS — Z6841 Body Mass Index (BMI) 40.0 and over, adult: Secondary | ICD-10-CM

## 2024-05-15 DIAGNOSIS — O99212 Obesity complicating pregnancy, second trimester: Secondary | ICD-10-CM

## 2024-05-15 DIAGNOSIS — Z98891 History of uterine scar from previous surgery: Secondary | ICD-10-CM | POA: Diagnosis not present

## 2024-05-15 DIAGNOSIS — Z36 Encounter for antenatal screening for chromosomal anomalies: Secondary | ICD-10-CM | POA: Diagnosis present

## 2024-05-15 DIAGNOSIS — Z3A23 23 weeks gestation of pregnancy: Secondary | ICD-10-CM | POA: Insufficient documentation

## 2024-05-15 DIAGNOSIS — O10012 Pre-existing essential hypertension complicating pregnancy, second trimester: Secondary | ICD-10-CM

## 2024-05-15 DIAGNOSIS — O34219 Maternal care for unspecified type scar from previous cesarean delivery: Secondary | ICD-10-CM | POA: Diagnosis not present

## 2024-05-15 DIAGNOSIS — Q969 Turner's syndrome, unspecified: Secondary | ICD-10-CM

## 2024-05-15 DIAGNOSIS — I10 Essential (primary) hypertension: Secondary | ICD-10-CM

## 2024-05-15 DIAGNOSIS — O0992 Supervision of high risk pregnancy, unspecified, second trimester: Secondary | ICD-10-CM

## 2024-05-15 LAB — MCC TRACKING

## 2024-05-15 NOTE — Progress Notes (Signed)
 Maternal-Fetal Medicine Consultation Name: Erica Colon MRN: [id number]  G2 P1001 at 23w 1d gestation.  Patient is here for completion of fetal anatomy. On cell-free fetal DNA screening, increased to risk for monosomy X was seen.  Subsequently, patient had an amniocentesis that showed a normal fetal karyotype (46,XX). She has a diagnosis of chronic hypertension in this pregnancy.  Blood pressure today at our office is 149/93 mmHg.  Ultrasound Fetal growth is appropriate for gestational age.  Amniotic fluid is normal and good fetal activity seen.  Fetal anatomical survey still could not be completed because of fetal position.  Previous cesarean delivery I discussed the benefits and risks of VBAC.  The risk of uterine scar dehiscence is about 1%.  Cesarean deliveries increase the risks of hemorrhage, infection and venous thromboembolism.  Repeat cesarean deliveries increase the risks of placenta previa and/or placenta accreta spectrum. Long-term complications include small bowel obstruction.  Based on Maternal-Fetal [facility] Network calculator, the likelihood of successful vaginal delivery is 25% (including the diagnosis of chronic hypertension). Patient will discuss with her provider about mode of delivery.  Patient takes low-dose aspirin  prophylaxis.    Recommendations: - She has appointments for fetal growth assessments.  Consultation including face-to-face (more than 50%) counseling 20 minutes.

## 2024-05-16 LAB — CHROMOSOME ANALYSIS W REFLEX TO SNP, AMNIOTIC
Cells Analyzed: 15
Cells Counted: 15
Cells Karyotyped: 2
Colonies: 15
GTG Band Resolution Achieved: 450

## 2024-05-16 LAB — CHROMOSOME MICROARRAY REFLEX, AMN FLD

## 2024-05-16 LAB — AFP, AMNIOTIC FLUID
AFP, Amniotic Fluid (mcg/ml): 7.7 ug/mL
Gestational Age(Wks): 20
MOM, Amniotic Fluid: 1.15

## 2024-05-16 LAB — MATERNAL CELL CONTAMINATION

## 2024-05-20 NOTE — Telephone Encounter (Addendum)
 I spoke with the patient to return the remainder of her amniocentesis results.  Karyotype was normal. 70, XX. No aneuploidy detected, female fetus. Chromosomal microarray was normal.  arr(X,1-22)x2 . No copy number variants detected, female fetus. AF-AFP wnl.  Lauraine Bodily, MS, [facility] Certified Genetic Counselor [facility]aternal [facility]etal [facility] [phone]

## 2024-05-22 ENCOUNTER — Encounter: Payer: Self-pay | Admitting: Obstetrics & Gynecology

## 2024-05-22 ENCOUNTER — Ambulatory Visit

## 2024-05-22 ENCOUNTER — Other Ambulatory Visit

## 2024-05-22 ENCOUNTER — Encounter: Admitting: Obstetrics & Gynecology

## 2024-05-28 ENCOUNTER — Encounter: Payer: Self-pay | Admitting: Obstetrics & Gynecology

## 2024-05-28 ENCOUNTER — Ambulatory Visit (INDEPENDENT_AMBULATORY_CARE_PROVIDER_SITE_OTHER): Admitting: Obstetrics & Gynecology

## 2024-05-28 VITALS — BP 137/91 | HR 98 | Wt 226.6 lb

## 2024-05-28 DIAGNOSIS — O10919 Unspecified pre-existing hypertension complicating pregnancy, unspecified trimester: Secondary | ICD-10-CM

## 2024-05-28 DIAGNOSIS — O10912 Unspecified pre-existing hypertension complicating pregnancy, second trimester: Secondary | ICD-10-CM

## 2024-05-28 DIAGNOSIS — Z6791 Unspecified blood type, Rh negative: Secondary | ICD-10-CM

## 2024-05-28 DIAGNOSIS — O0992 Supervision of high risk pregnancy, unspecified, second trimester: Secondary | ICD-10-CM

## 2024-05-28 DIAGNOSIS — O26892 Other specified pregnancy related conditions, second trimester: Secondary | ICD-10-CM

## 2024-05-28 DIAGNOSIS — Z3A25 25 weeks gestation of pregnancy: Secondary | ICD-10-CM

## 2024-05-28 NOTE — Progress Notes (Signed)
 HIGH-RISK PREGNANCY VISIT Patient name: Erica Colon MRN [id number]  Date of birth: December 05, 2000 Chief Complaint:   Routine Prenatal Visit  History of Present Illness:   Erica Colon is a 23 y.o. G46P1001 female at [redacted]w[redacted]d with an Estimated Date of Delivery: 09/10/24 being seen today for ongoing management of a high-risk pregnancy complicated by:  -Chronic HTN -initially concern for monosomy X, amniocentesis normal -Rh neg -Prior C-section  Today she reports no complaints.   Contractions: Irritability. Vag. Bleeding: None.  Movement: Present. denies leaking of fluid.      03/05/2024    9:29 AM 03/27/2022    8:37 AM 12/21/2021   10:49 AM 11/17/2021   10:18 AM 10/05/2021    3:09 PM  Depression screen PHQ 2/9  Decreased Interest 0 1 0 1 2  Down, Depressed, Hopeless 1 0 0 0 2  PHQ - 2 Score 1 1 0 1 4  Altered sleeping 1 2 2 2 3   Tired, decreased energy 2 2 2 2 3   Change in appetite 2 2 2 2 3   Feeling bad or failure about yourself  0 0 0 1 2  Trouble concentrating 2 0 0 2 2  Moving slowly or fidgety/restless 0 0 0 1 2  Suicidal thoughts 0 0 0 0 0  PHQ-9 Score 8 7 6 11 19   Difficult doing work/chores     Very difficult     Current Outpatient Medications  Medication Instructions   aspirin  162 mg, Oral, Daily   Blood Pressure Monitor MISC For regular home bp monitoring during pregnancy   Blood Pressure Monitor MISC For regular home bp monitoring during pregnancy   Prenatal Vit-Fe Fumarate-FA (PRENATAL VITAMIN PO) Take by mouth.     Review of Systems:   Pertinent items are noted in HPI Denies abnormal vaginal discharge w/ itching/odor/irritation, headaches, visual changes, shortness of breath, chest pain, abdominal pain, severe nausea/vomiting, or problems with urination or bowel movements unless otherwise stated above. Pertinent History Reviewed:  Reviewed past medical,surgical, social, obstetrical and family history.  Reviewed problem list, medications and  allergies. Physical Assessment:   Vitals:   05/28/24 1332 05/28/24 1348  BP: 137/86 (!) 137/91  Pulse: 98   Weight: 226 lb 9.6 oz (102.8 kg)   Body mass index is 41.45 kg/m.           Physical Examination:   General appearance: alert, well appearing, and in no distress  Mental status: normal mood, behavior, speech, dress, motor activity, and thought processes  Skin: warm & dry   Extremities:      Cardiovascular: normal heart rate noted  Respiratory: normal respiratory effort, no distress  Abdomen: gravid, soft, non-tender  Pelvic: Cervical exam deferred         Fetal Status: Fetal Heart Rate (bpm): 150 Fundal Height: 25 cm Movement: Present    Fetal Surveillance Testing today: doppler   Chaperone: N/A    No results found for this or any previous visit (from the past 24 hours).   Assessment & Plan:  High-risk pregnancy: G2P1001 at [redacted]w[redacted]d with an Estimated Date of Delivery: 09/10/24   1) Chronic HTN -no meds currently, will continue to monitor -continue growth every 4wks, next scheduled 9/11  2) Rh neg S/p RhoGAM 7/17  3) Prior C-section -desires repeat - Initially she was also considering bilateral salpingectomy.  Reviewed risk benefits including permanent sterilization and concern for regret.  After much discussion patient does plan to proceed with other form of contraception  Meds:  No orders of the defined types were placed in this encounter.   Labs/procedures today: none  Treatment Plan:  routine OB care and as outlined above  Reviewed: Preterm labor symptoms and general obstetric precautions including but not limited to vaginal bleeding, contractions, leaking of fluid and fetal movement were reviewed in detail with the patient.  All questions were answered. PT has home bp cuff. Check bp weekly, let us  know if >140/90.   Follow-up: Return in about 3 weeks (around 06/18/2024) for HROB visit and PN-2.   Future Appointments  Date Time Provider Department Center   06/19/2024  8:30 AM [facility] LAB [facility]  06/19/2024  3:00 PM [facility] US  [facility] None  06/19/2024  3:50 PM Marilynn Nest, DO [facility]  06/24/2024  2:45 PM [facility] PROVIDER 1 [facility]  06/24/2024  3:00 PM [facility] US1 [facility] [place]  07/17/2024 10:15 AM [facility] PROVIDER 1 [facility]  07/17/2024 10:30 AM [facility] US2 [facility]  07/17/2024  3:00 PM [facility] US  [facility] None  07/21/2024  3:30 PM [facility] NURSE [facility]  07/28/2024  3:30 PM [facility] NURSE [facility]  07/31/2024  3:00 PM [facility] US  [facility] None  07/31/2024  3:50 PM Vessie Olmsted, Nest, DO [facility]  08/04/2024  3:30 PM [facility] NURSE [facility]  08/07/2024  3:00 PM [facility] US  [facility] None  08/11/2024  3:30 PM [facility] NURSE [facility]  08/14/2024  2:15 PM [facility] US  [facility] None  08/18/2024  3:30 PM [facility] NURSE [facility]  08/21/2024  3:00 PM [facility] US  [facility] None  08/25/2024  3:30 PM [facility] NURSE [facility]  08/28/2024  3:00 PM [facility] US  [facility] None  09/01/2024  3:30 PM [facility] NURSE [facility]  09/08/2024  3:30 PM [facility] NURSE [facility]    No orders of the defined types were placed in this encounter.   Donnajean Chesnut, DO Attending Obstetrician & Gynecologist, [facility] for [employer], [facility]

## 2024-05-29 ENCOUNTER — Encounter: Admitting: Obstetrics & Gynecology

## 2024-05-30 ENCOUNTER — Other Ambulatory Visit: Payer: Self-pay | Admitting: Radiology

## 2024-06-09 ENCOUNTER — Encounter (HOSPITAL_COMMUNITY): Payer: Self-pay | Admitting: Obstetrics & Gynecology

## 2024-06-09 ENCOUNTER — Inpatient Hospital Stay (HOSPITAL_COMMUNITY)
Admission: AD | Admit: 2024-06-09 | Discharge: 2024-06-09 | Disposition: A | Discharge status: Home / Self Care | Attending: Obstetrics & Gynecology | Admitting: Obstetrics & Gynecology

## 2024-06-09 DIAGNOSIS — O10012 Pre-existing essential hypertension complicating pregnancy, second trimester: Secondary | ICD-10-CM | POA: Insufficient documentation

## 2024-06-09 DIAGNOSIS — Z3A26 26 weeks gestation of pregnancy: Secondary | ICD-10-CM

## 2024-06-09 DIAGNOSIS — O0992 Supervision of high risk pregnancy, unspecified, second trimester: Secondary | ICD-10-CM | POA: Insufficient documentation

## 2024-06-09 DIAGNOSIS — O26892 Other specified pregnancy related conditions, second trimester: Secondary | ICD-10-CM

## 2024-06-09 DIAGNOSIS — I10 Essential (primary) hypertension: Secondary | ICD-10-CM

## 2024-06-09 DIAGNOSIS — Z6791 Unspecified blood type, Rh negative: Secondary | ICD-10-CM | POA: Insufficient documentation

## 2024-06-09 DIAGNOSIS — M7989 Other specified soft tissue disorders: Secondary | ICD-10-CM | POA: Diagnosis present

## 2024-06-09 DIAGNOSIS — Z3689 Encounter for other specified antenatal screening: Secondary | ICD-10-CM

## 2024-06-09 LAB — CBC
HCT: 40.2 % (ref 36.0–46.0)
Hemoglobin: 12.9 g/dL (ref 12.0–15.0)
MCH: 27.2 pg (ref 26.0–34.0)
MCHC: 32.1 g/dL (ref 30.0–36.0)
MCV: 84.6 fL (ref 80.0–100.0)
Platelets: 377 K/uL (ref 150–400)
RBC: 4.75 MIL/uL (ref 3.87–5.11)
RDW: 15.7 % — ABNORMAL HIGH (ref 11.5–15.5)
WBC: 14.6 K/uL — ABNORMAL HIGH (ref 4.0–10.5)
nRBC: 0 % (ref 0.0–0.2)

## 2024-06-09 LAB — URINALYSIS, ROUTINE W REFLEX MICROSCOPIC
Bilirubin Urine: NEGATIVE
Glucose, UA: NEGATIVE mg/dL
Hgb urine dipstick: NEGATIVE
Ketones, ur: NEGATIVE mg/dL
Leukocytes,Ua: NEGATIVE
Nitrite: NEGATIVE
Protein, ur: NEGATIVE mg/dL
Specific Gravity, Urine: 1.015 (ref 1.005–1.030)
pH: 6 (ref 5.0–8.0)

## 2024-06-09 LAB — COMPREHENSIVE METABOLIC PANEL WITH GFR
ALT: 11 U/L (ref 0–44)
AST: 19 U/L (ref 15–41)
Albumin: 2.6 g/dL — ABNORMAL LOW (ref 3.5–5.0)
Alkaline Phosphatase: 76 U/L (ref 38–126)
Anion gap: 11 (ref 5–15)
BUN: 11 mg/dL (ref 6–20)
CO2: 21 mmol/L — ABNORMAL LOW (ref 22–32)
Calcium: 9.3 mg/dL (ref 8.9–10.3)
Chloride: 106 mmol/L (ref 98–111)
Creatinine, Ser: 0.75 mg/dL (ref 0.44–1.00)
GFR, Estimated: >60 mL/min (ref >60)
Glucose, Bld: 120 mg/dL — ABNORMAL HIGH (ref 70–99)
Potassium: 3.6 mmol/L (ref 3.5–5.1)
Sodium: 138 mmol/L (ref 135–145)
Total Bilirubin: 0.2 mg/dL (ref 0.0–1.2)
Total Protein: 6.1 g/dL — ABNORMAL LOW (ref 6.5–8.1)

## 2024-06-09 LAB — PROTEIN / CREATININE RATIO, URINE
Creatinine, Urine: 93 mg/dL
Protein Creatinine Ratio: 0.1 mg/mg{creat} (ref 0.00–0.15)
Total Protein, Urine: 9 mg/dL

## 2024-06-09 MED ORDER — NIFEDIPINE 10 MG PO CAPS: 20.0000 mg | ORAL_CAPSULE | ORAL | Status: DC | PRN

## 2024-06-09 MED ORDER — NIFEDIPINE ER OSMOTIC RELEASE 30 MG PO TB24: 30.0000 mg | ORAL_TABLET | Freq: Every day | ORAL | 3 refills | Status: DC

## 2024-06-09 MED ORDER — LABETALOL HCL 5 MG/ML IV SOLN: 40.0000 mg | INTRAVENOUS | Status: DC | PRN

## 2024-06-09 MED ORDER — NIFEDIPINE 10 MG PO CAPS
10.0000 mg | ORAL_CAPSULE | ORAL | Status: DC | PRN
  Administered 2024-06-09: 10 mg via ORAL
  Filled 2024-06-09: qty 1

## 2024-06-09 NOTE — MAU Note (Signed)
 Erica Colon is a 23 y.o. at [redacted]w[redacted]d here in MAU reporting: has had blurred vision today and feels her legs and feet are swollen hurts to walk on them. Checked b/p 152/102. Denies headache at this time but has had the off and on. Good fetal movement reported.   LMP:  Onset of complaint: today Pain score: 0 Vitals:   06/09/24 1752  BP: (!) 162/94  Pulse: (!) 112  Resp: 18  Temp: 98.6 F (37 C)     FHT: 128  Lab orders placed from triage: u/a. PCR
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
## 2024-06-18 ENCOUNTER — Other Ambulatory Visit: Payer: Self-pay | Admitting: Obstetrics & Gynecology

## 2024-06-18 DIAGNOSIS — O285 Abnormal chromosomal and genetic finding on antenatal screening of mother: Secondary | ICD-10-CM

## 2024-06-18 DIAGNOSIS — O10919 Unspecified pre-existing hypertension complicating pregnancy, unspecified trimester: Secondary | ICD-10-CM

## 2024-06-19 ENCOUNTER — Ambulatory Visit

## 2024-06-19 ENCOUNTER — Other Ambulatory Visit

## 2024-06-19 ENCOUNTER — Ambulatory Visit: Admitting: Obstetrics & Gynecology

## 2024-06-19 ENCOUNTER — Other Ambulatory Visit: Payer: Self-pay | Admitting: Obstetrics & Gynecology

## 2024-06-19 VITALS — BP 163/97 | HR 97

## 2024-06-19 DIAGNOSIS — O365990 Maternal care for other known or suspected poor fetal growth, unspecified trimester, not applicable or unspecified: Secondary | ICD-10-CM

## 2024-06-19 DIAGNOSIS — O34219 Maternal care for unspecified type scar from previous cesarean delivery: Secondary | ICD-10-CM | POA: Diagnosis not present

## 2024-06-19 DIAGNOSIS — O0993 Supervision of high risk pregnancy, unspecified, third trimester: Secondary | ICD-10-CM

## 2024-06-19 DIAGNOSIS — I10 Essential (primary) hypertension: Secondary | ICD-10-CM

## 2024-06-19 DIAGNOSIS — O285 Abnormal chromosomal and genetic finding on antenatal screening of mother: Secondary | ICD-10-CM

## 2024-06-19 DIAGNOSIS — O10913 Unspecified pre-existing hypertension complicating pregnancy, third trimester: Secondary | ICD-10-CM

## 2024-06-19 DIAGNOSIS — Z3A28 28 weeks gestation of pregnancy: Secondary | ICD-10-CM

## 2024-06-19 DIAGNOSIS — O0992 Supervision of high risk pregnancy, unspecified, second trimester: Secondary | ICD-10-CM

## 2024-06-19 DIAGNOSIS — Z87898 Personal history of other specified conditions: Secondary | ICD-10-CM | POA: Insufficient documentation

## 2024-06-19 DIAGNOSIS — O10919 Unspecified pre-existing hypertension complicating pregnancy, unspecified trimester: Secondary | ICD-10-CM

## 2024-06-19 DIAGNOSIS — Z6791 Unspecified blood type, Rh negative: Secondary | ICD-10-CM

## 2024-06-19 DIAGNOSIS — O365930 Maternal care for other known or suspected poor fetal growth, third trimester, not applicable or unspecified: Secondary | ICD-10-CM | POA: Diagnosis not present

## 2024-06-19 DIAGNOSIS — Z131 Encounter for screening for diabetes mellitus: Secondary | ICD-10-CM

## 2024-06-19 NOTE — Progress Notes (Signed)
  US  28+1 wks,cephalic,anterior placenta gr 1,BPP 8/8,RI .63,.70,.74=64%,EFW 917 g 3%,AC 2%,AFI 14 cm,limited view of spine,left arm visualized,unable to visualize left hand because of position

## 2024-06-19 NOTE — Progress Notes (Signed)
 HIGH-RISK PREGNANCY VISIT Patient name: Erica Colon MRN [id number]  Date of birth: 06/10/2001 Chief Complaint:   Routine Prenatal Visit  History of Present Illness:   Erica Colon is a 23 y.o. G66P1001 female at [redacted]w[redacted]d with an Estimated Date of Delivery: 09/10/24 being seen today for ongoing management of a high-risk pregnancy complicated by:  -Chronic HTN Taking Procardia  30 XL daily however due to Glucola test this a.m. she was not able to take her Medicaid -initially concern for monosomy X, amniocentesis normal -Rh neg -Prior C-section  Patient does not feel well today.  She notes some nausea and vomiting likely due to the Glucola this a.m.  Denies headache or blurry vision  Contractions: Not present. Vag. Bleeding: None.  Movement: Present. denies leaking of fluid.      03/05/2024    9:29 AM 03/27/2022    8:37 AM 12/21/2021   10:49 AM 11/17/2021   10:18 AM 10/05/2021    3:09 PM  Depression screen PHQ 2/9  Decreased Interest 0 1 0 1 2  Down, Depressed, Hopeless 1 0 0 0 2  PHQ - 2 Score 1 1 0 1 4  Altered sleeping 1 2 2 2 3   Tired, decreased energy 2 2 2 2 3   Change in appetite 2 2 2 2 3   Feeling bad or failure about yourself  0 0 0 1 2  Trouble concentrating 2 0 0 2 2  Moving slowly or fidgety/restless 0 0 0 1 2  Suicidal thoughts 0 0 0 0 0  PHQ-9 Score 8 7 6 11 19   Difficult doing work/chores     Very difficult     Current Outpatient Medications  Medication Instructions   aspirin  162 mg, Oral, Daily   Blood Pressure Monitor MISC For regular home bp monitoring during pregnancy   NIFEdipine  (PROCARDIA -XL/NIFEDICAL-XL) 30 mg, Oral, Daily   Prenatal Vit-Fe Fumarate-FA (PRENATAL VITAMIN PO) Take by mouth.     Review of Systems:   Pertinent items are noted in HPI Denies shortness of breath, chest pain, abdominal pain, severe nausea/vomiting, or problems with urination or bowel movements unless otherwise stated above. Pertinent History Reviewed:  Reviewed past  medical,surgical, social, obstetrical and family history.  Reviewed problem list, medications and allergies. Physical Assessment:   Vitals:   06/19/24 1600 06/19/24 1604  BP: (!) 146/97 (!) 163/97  Pulse: 99 97  There is no height or weight on file to calculate BMI.           Physical Examination:   General appearance: alert, well appearing, and in no distress  Mental status: normal mood, behavior, speech, dress, motor activity, and thought processes  Skin: warm & dry   Extremities:   No edema   Cardiovascular: normal heart rate noted  Respiratory: normal respiratory effort, no distress  Abdomen: gravid, soft, non-tender  Pelvic: Cervical exam deferred         Fetal Status:     Movement: Present    Fetal Surveillance Testing today: cephalic,anterior placenta gr 1,BPP 8/8,RI .63,.70,.74=64%,EFW 917 g 3%,AC 2%,AFI 14 cm,limited view of spine,left arm visualized,unable to visualize left hand because of position    Chaperone: N/A    No results found for this or any previous visit (from the past 24 hours).   Assessment & Plan:  High-risk pregnancy: G2P1001 at [redacted]w[redacted]d with an Estimated Date of Delivery: 09/10/24   1) Chronic HTN - Patient to take medicine when she gets home - Will plan for BP check tomorrow -  Patient has BP cuff at home  2) FGR - Reviewed scan today suggestive of FGR, normal Dopplers - Will plan for 1 to 2-week Dopplers and growth every 3 to 4 weeks - Antepartum testing at 32 weeks had already previously been scheduled due to her chronic hypertension  3) Prior C-section 4) Rh neg, []  plan for RhoGAM tmr  Meds: No orders of the defined types were placed in this encounter.   Labs/procedures today: growth scan/PN2 this am  Treatment Plan: Routine OB care and as outlined above  Reviewed: Preterm labor symptoms and general obstetric precautions including but not limited to vaginal bleeding, contractions, leaking of fluid and fetal movement were reviewed in detail  with the patient.  All questions were answered.  Patient has home bp cuff. Check bp weekly, let us  know if >140/90.   Follow-up: No follow-ups on file.   Future Appointments  Date Time Provider Department Center  07/03/2024  3:50 PM Kizzie Suzen [place], [place] [facility]  07/17/2024  3:00 PM [facility]  [facility] None  07/17/2024  3:50 PM Jayne Vonn DEL, MD [facility]  07/21/2024  3:30 PM [facility]  07/24/2024 10:45 AM [facility]  [facility] None  07/24/2024 11:30 AM Kizzie Suzen [place], CNM [facility]  07/28/2024  3:30 PM [facility]  07/31/2024  3:00 PM [facility]  [facility] None  07/31/2024  3:50 PM Marilynn Nest, DO [facility]  08/04/2024  3:30 PM [facility]  08/07/2024  3:00 PM [facility]  [facility] None  08/07/2024  3:50 PM Marilynn Nest, DO [facility]  08/11/2024  3:30 PM [facility] N[facility]  08/14/2024  2:15 PM [facility]  [facility] None  08/14/2024  3:10 PM Marilynn Nest, DO [facility]  08/18/2024  3:30 PM [facility]  08/21/2024  3:00 PM [facility]  [facility] None  08/21/2024  3:50 PM Marilynn Nest, DO [facility]  08/25/2024  3:30 PM [facility]  08/28/2024  3:00 PM [facility]  [facility] None  08/28/2024  3:50 PM Marilynn Nest, DO [facility]  09/01/2024  3:00 PM [facility]G 2 [facility] None  09/01/2024  3:50 PM [facility]  09/01/2024  4:10 PM Jayne Vonn DEL, MD [facility]  09/08/2024  3:30 PM [facility]    No orders of the defined types were placed in this encounter.   Monie Shere, DO Attending Obstetrician & Gynecologist, [facility] for [employer], [facility]

## 2024-06-20 ENCOUNTER — Encounter (HOSPITAL_COMMUNITY): Payer: Self-pay

## 2024-06-20 ENCOUNTER — Other Ambulatory Visit: Payer: Self-pay

## 2024-06-20 ENCOUNTER — Inpatient Hospital Stay (HOSPITAL_COMMUNITY)
Admission: EM | Admit: 2024-06-20 | Discharge: 2024-06-24 | DRG: 832 | Disposition: A | Discharge status: Home / Self Care | Payer: MEDICAID | Attending: Obstetrics & Gynecology | Admitting: Obstetrics & Gynecology

## 2024-06-20 ENCOUNTER — Observation Stay (HOSPITAL_BASED_OUTPATIENT_CLINIC_OR_DEPARTMENT_OTHER): Payer: MEDICAID

## 2024-06-20 ENCOUNTER — Ambulatory Visit: Payer: Self-pay | Admitting: Obstetrics & Gynecology

## 2024-06-20 ENCOUNTER — Emergency Department (HOSPITAL_COMMUNITY): Payer: MEDICAID

## 2024-06-20 ENCOUNTER — Ambulatory Visit

## 2024-06-20 ENCOUNTER — Other Ambulatory Visit: Payer: Self-pay | Admitting: Obstetrics & Gynecology

## 2024-06-20 DIAGNOSIS — F419 Anxiety disorder, unspecified: Secondary | ICD-10-CM | POA: Diagnosis present

## 2024-06-20 DIAGNOSIS — Z7982 Long term (current) use of aspirin: Secondary | ICD-10-CM | POA: Diagnosis not present

## 2024-06-20 DIAGNOSIS — Z6791 Unspecified blood type, Rh negative: Secondary | ICD-10-CM | POA: Diagnosis not present

## 2024-06-20 DIAGNOSIS — O285 Abnormal chromosomal and genetic finding on antenatal screening of mother: Secondary | ICD-10-CM

## 2024-06-20 DIAGNOSIS — O9A213 Injury, poisoning and certain other consequences of external causes complicating pregnancy, third trimester: Secondary | ICD-10-CM

## 2024-06-20 DIAGNOSIS — Z3A28 28 weeks gestation of pregnancy: Secondary | ICD-10-CM

## 2024-06-20 DIAGNOSIS — Z6841 Body Mass Index (BMI) 40.0 and over, adult: Secondary | ICD-10-CM

## 2024-06-20 DIAGNOSIS — I82890 Acute embolism and thrombosis of other specified veins: Secondary | ICD-10-CM | POA: Diagnosis present

## 2024-06-20 DIAGNOSIS — O113 Pre-existing hypertension with pre-eclampsia, third trimester: Secondary | ICD-10-CM | POA: Diagnosis present

## 2024-06-20 DIAGNOSIS — O99213 Obesity complicating pregnancy, third trimester: Secondary | ICD-10-CM | POA: Diagnosis present

## 2024-06-20 DIAGNOSIS — R000 Tachycardia, unspecified: Secondary | ICD-10-CM | POA: Diagnosis present

## 2024-06-20 DIAGNOSIS — O2223 Superficial thrombophlebitis in pregnancy, third trimester: Secondary | ICD-10-CM | POA: Diagnosis present

## 2024-06-20 DIAGNOSIS — O99343 Other mental disorders complicating pregnancy, third trimester: Secondary | ICD-10-CM | POA: Diagnosis present

## 2024-06-20 DIAGNOSIS — O26893 Other specified pregnancy related conditions, third trimester: Secondary | ICD-10-CM | POA: Diagnosis present

## 2024-06-20 DIAGNOSIS — Z87898 Personal history of other specified conditions: Secondary | ICD-10-CM | POA: Diagnosis present

## 2024-06-20 DIAGNOSIS — E876 Hypokalemia: Secondary | ICD-10-CM | POA: Diagnosis present

## 2024-06-20 DIAGNOSIS — F32A Depression, unspecified: Secondary | ICD-10-CM | POA: Diagnosis present

## 2024-06-20 DIAGNOSIS — R1030 Lower abdominal pain, unspecified: Secondary | ICD-10-CM | POA: Diagnosis present

## 2024-06-20 DIAGNOSIS — Y92410 Unspecified street and highway as the place of occurrence of the external cause: Secondary | ICD-10-CM

## 2024-06-20 DIAGNOSIS — O368330 Maternal care for abnormalities of the fetal heart rate or rhythm, third trimester, not applicable or unspecified: Secondary | ICD-10-CM | POA: Diagnosis present

## 2024-06-20 DIAGNOSIS — O99113 Other diseases of the blood and blood-forming organs and certain disorders involving the immune mechanism complicating pregnancy, third trimester: Secondary | ICD-10-CM | POA: Diagnosis present

## 2024-06-20 DIAGNOSIS — R0789 Other chest pain: Secondary | ICD-10-CM | POA: Diagnosis present

## 2024-06-20 DIAGNOSIS — D72829 Elevated white blood cell count, unspecified: Secondary | ICD-10-CM | POA: Diagnosis present

## 2024-06-20 DIAGNOSIS — T148XXA Other injury of unspecified body region, initial encounter: Secondary | ICD-10-CM | POA: Diagnosis present

## 2024-06-20 DIAGNOSIS — O10913 Unspecified pre-existing hypertension complicating pregnancy, third trimester: Secondary | ICD-10-CM | POA: Diagnosis present

## 2024-06-20 DIAGNOSIS — O88213 Thromboembolism in pregnancy, third trimester: Secondary | ICD-10-CM | POA: Diagnosis not present

## 2024-06-20 DIAGNOSIS — O99210 Obesity complicating pregnancy, unspecified trimester: Secondary | ICD-10-CM | POA: Diagnosis present

## 2024-06-20 DIAGNOSIS — O365990 Maternal care for other known or suspected poor fetal growth, unspecified trimester, not applicable or unspecified: Secondary | ICD-10-CM | POA: Diagnosis present

## 2024-06-20 DIAGNOSIS — Z79899 Other long term (current) drug therapy: Secondary | ICD-10-CM | POA: Diagnosis not present

## 2024-06-20 DIAGNOSIS — I10 Essential (primary) hypertension: Secondary | ICD-10-CM | POA: Diagnosis present

## 2024-06-20 DIAGNOSIS — E669 Obesity, unspecified: Secondary | ICD-10-CM

## 2024-06-20 DIAGNOSIS — Z833 Family history of diabetes mellitus: Secondary | ICD-10-CM | POA: Diagnosis not present

## 2024-06-20 DIAGNOSIS — I82611 Acute embolism and thrombosis of superficial veins of right upper extremity: Secondary | ICD-10-CM | POA: Diagnosis present

## 2024-06-20 DIAGNOSIS — O34219 Maternal care for unspecified type scar from previous cesarean delivery: Secondary | ICD-10-CM | POA: Diagnosis present

## 2024-06-20 DIAGNOSIS — Z98891 History of uterine scar from previous surgery: Secondary | ICD-10-CM

## 2024-06-20 DIAGNOSIS — O26899 Other specified pregnancy related conditions, unspecified trimester: Secondary | ICD-10-CM

## 2024-06-20 DIAGNOSIS — O365930 Maternal care for other known or suspected poor fetal growth, third trimester, not applicable or unspecified: Secondary | ICD-10-CM | POA: Diagnosis present

## 2024-06-20 DIAGNOSIS — T07XXXA Unspecified multiple injuries, initial encounter: Secondary | ICD-10-CM

## 2024-06-20 DIAGNOSIS — O10013 Pre-existing essential hypertension complicating pregnancy, third trimester: Secondary | ICD-10-CM

## 2024-06-20 DIAGNOSIS — Z8249 Family history of ischemic heart disease and other diseases of the circulatory system: Secondary | ICD-10-CM

## 2024-06-20 DIAGNOSIS — S3991XA Unspecified injury of abdomen, initial encounter: Secondary | ICD-10-CM | POA: Diagnosis present

## 2024-06-20 DIAGNOSIS — O0990 Supervision of high risk pregnancy, unspecified, unspecified trimester: Secondary | ICD-10-CM

## 2024-06-20 DIAGNOSIS — O23593 Infection of other part of genital tract in pregnancy, third trimester: Secondary | ICD-10-CM | POA: Diagnosis not present

## 2024-06-20 DIAGNOSIS — O119 Pre-existing hypertension with pre-eclampsia, unspecified trimester: Secondary | ICD-10-CM | POA: Diagnosis present

## 2024-06-20 LAB — CBC
HCT: 41.7 % (ref 36.0–46.0)
Hemoglobin: 13.4 g/dL (ref 12.0–15.0)
MCH: 27.3 pg (ref 26.0–34.0)
MCHC: 32.1 g/dL (ref 30.0–36.0)
MCV: 85.1 fL (ref 80.0–100.0)
Platelets: 438 K/uL — ABNORMAL HIGH (ref 150–400)
RBC: 4.9 MIL/uL (ref 3.87–5.11)
RDW: 16.2 % — ABNORMAL HIGH (ref 11.5–15.5)
WBC: 23.1 K/uL — ABNORMAL HIGH (ref 4.0–10.5)
nRBC: 0 % (ref 0.0–0.2)

## 2024-06-20 LAB — PROTEIN / CREATININE RATIO, URINE
Creatinine, Urine: 191 mg/dL
Protein Creatinine Ratio: 1.13 mg/mg{creat} — ABNORMAL HIGH (ref 0.00–0.15)
Total Protein, Urine: 215 mg/dL

## 2024-06-20 LAB — COMPREHENSIVE METABOLIC PANEL WITH GFR
ALT: 12 U/L (ref 0–44)
AST: 22 U/L (ref 15–41)
Albumin: 3 g/dL — ABNORMAL LOW (ref 3.5–5.0)
Alkaline Phosphatase: 101 U/L (ref 38–126)
Anion gap: 14 (ref 5–15)
BUN: 8 mg/dL (ref 6–20)
CO2: 18 mmol/L — ABNORMAL LOW (ref 22–32)
Calcium: 9.2 mg/dL (ref 8.9–10.3)
Chloride: 105 mmol/L (ref 98–111)
Creatinine, Ser: 0.76 mg/dL (ref 0.44–1.00)
GFR, Estimated: >60 mL/min (ref >60)
Glucose, Bld: 95 mg/dL (ref 70–99)
Potassium: 3.7 mmol/L (ref 3.5–5.1)
Sodium: 137 mmol/L (ref 135–145)
Total Bilirubin: 0.2 mg/dL (ref 0.0–1.2)
Total Protein: 6.8 g/dL (ref 6.5–8.1)

## 2024-06-20 LAB — URINALYSIS, ROUTINE W REFLEX MICROSCOPIC
Bilirubin Urine: NEGATIVE
Glucose, UA: NEGATIVE mg/dL
Hgb urine dipstick: NEGATIVE
Ketones, ur: 5 mg/dL — AB
Leukocytes,Ua: NEGATIVE
Nitrite: NEGATIVE
Protein, ur: >=300 mg/dL — AB
Specific Gravity, Urine: >1.046 — ABNORMAL HIGH (ref 1.005–1.030)
pH: 6 (ref 5.0–8.0)

## 2024-06-20 LAB — I-STAT CG4 LACTIC ACID, ED: Lactic Acid, Venous: 2.3 mmol/L (ref 0.5–1.9)

## 2024-06-20 LAB — AB SCR+ANTIBODY ID

## 2024-06-20 LAB — I-STAT CHEM 8, ED
BUN: 12 mg/dL (ref 6–20)
Calcium, Ion: 1.14 mmol/L — ABNORMAL LOW (ref 1.15–1.40)
Chloride: 106 mmol/L (ref 98–111)
Creatinine, Ser: 0.7 mg/dL (ref 0.44–1.00)
Glucose, Bld: 100 mg/dL — ABNORMAL HIGH (ref 70–99)
HCT: 43 % (ref 36.0–46.0)
Hemoglobin: 14.6 g/dL (ref 12.0–15.0)
Potassium: 3.7 mmol/L (ref 3.5–5.1)
Sodium: 139 mmol/L (ref 135–145)
TCO2: 21 mmol/L — ABNORMAL LOW (ref 22–32)

## 2024-06-20 LAB — KLEIHAUER-BETKE STAIN
# Vials RhIg: 1
Fetal Cells %: 0 %
Quantitation Fetal Hemoglobin: 0 mL

## 2024-06-20 LAB — ETHANOL: Alcohol, Ethyl (B): <15 mg/dL (ref <15)

## 2024-06-20 LAB — LACTIC ACID, PLASMA: Lactic Acid, Venous: 2.1 mmol/L (ref 0.5–1.9)

## 2024-06-20 LAB — PROTIME-INR
INR: 0.9 (ref 0.8–1.2)
Prothrombin Time: 12.5 s (ref 11.4–15.2)

## 2024-06-20 LAB — HCG, SERUM, QUALITATIVE: Preg, Serum: POSITIVE — AB

## 2024-06-20 MED ORDER — LACTATED RINGERS IV SOLN
125.0000 mL/h | INTRAVENOUS | Status: AC
Start: 2024-06-21 — End: 2024-06-22

## 2024-06-20 MED ORDER — FENTANYL CITRATE PF 50 MCG/ML IJ SOSY
50.0000 ug | PREFILLED_SYRINGE | Freq: Once | INTRAMUSCULAR | Status: AC
  Administered 2024-06-20: 50 ug via INTRAVENOUS
  Filled 2024-06-20: qty 1

## 2024-06-20 MED ORDER — ACETAMINOPHEN 325 MG PO TABS: 650.0000 mg | ORAL_TABLET | ORAL | Status: DC | PRN

## 2024-06-20 MED ORDER — LABETALOL HCL 5 MG/ML IV SOLN
40.0000 mg | INTRAVENOUS | Status: DC | PRN
  Administered 2024-06-20: 40 mg via INTRAVENOUS
  Filled 2024-06-20 (×3): qty 8

## 2024-06-20 MED ORDER — LACTATED RINGERS IV BOLUS
1000.0000 mL | Freq: Once | INTRAVENOUS | Status: AC
  Administered 2024-06-20: 1000 mL via INTRAVENOUS

## 2024-06-20 MED ORDER — LACTATED RINGERS IV SOLN
INTRAVENOUS | Status: AC
Start: 2024-06-20 — End: 2024-06-21

## 2024-06-20 MED ORDER — NIFEDIPINE ER OSMOTIC RELEASE 60 MG PO TB24
60.0000 mg | ORAL_TABLET | Freq: Two times a day (BID) | ORAL | Status: DC
  Administered 2024-06-20 – 2024-06-24 (×8): 60 mg via ORAL
  Filled 2024-06-20 (×8): qty 1

## 2024-06-20 MED ORDER — RHO D IMMUNE GLOBULIN 1500 UNIT/2ML IJ SOSY
300.0000 ug | PREFILLED_SYRINGE | Freq: Once | INTRAMUSCULAR | Status: AC
  Administered 2024-06-20: 300 ug via INTRAVENOUS
  Filled 2024-06-20: qty 2

## 2024-06-20 MED ORDER — LABETALOL HCL 5 MG/ML IV SOLN
20.0000 mg | Freq: Once | INTRAVENOUS | Status: AC
Start: 2024-06-20 — End: 2024-06-20

## 2024-06-20 MED ORDER — IOHEXOL 350 MG/ML SOLN
100.0000 mL | Freq: Once | INTRAVENOUS | Status: AC | PRN
  Administered 2024-06-20: 100 mL via INTRAVENOUS

## 2024-06-20 MED ORDER — PRENATAL MULTIVITAMIN CH
1.0000 | ORAL_TABLET | Freq: Every day | ORAL | Status: DC
  Administered 2024-06-20 – 2024-06-24 (×5): 1 via ORAL
  Filled 2024-06-20 (×5): qty 1

## 2024-06-20 MED ORDER — LABETALOL HCL 5 MG/ML IV SOLN
INTRAVENOUS | Status: AC
  Administered 2024-06-20: 20 mg via INTRAVENOUS
  Filled 2024-06-20: qty 4

## 2024-06-20 MED ORDER — HYDRALAZINE HCL 20 MG/ML IJ SOLN: 10.0000 mg | INTRAMUSCULAR | Status: DC | PRN

## 2024-06-20 MED ORDER — OXYCODONE-ACETAMINOPHEN 5-325 MG PO TABS
1.0000 | ORAL_TABLET | ORAL | Status: DC | PRN
  Administered 2024-06-21 – 2024-06-22 (×3): 2 via ORAL
  Filled 2024-06-20 (×3): qty 2

## 2024-06-20 MED ORDER — CALCIUM CARBONATE ANTACID 500 MG PO CHEW: 2.0000 | CHEWABLE_TABLET | ORAL | Status: DC | PRN

## 2024-06-20 MED ORDER — LABETALOL HCL 5 MG/ML IV SOLN
80.0000 mg | INTRAVENOUS | Status: DC | PRN
  Administered 2024-06-20: 80 mg via INTRAVENOUS
  Filled 2024-06-20: qty 16

## 2024-06-20 MED ORDER — LACTATED RINGERS IV BOLUS: 1000.0000 mL | Freq: Once | INTRAVENOUS | Status: DC

## 2024-06-20 MED ORDER — LABETALOL HCL 5 MG/ML IV SOLN
20.0000 mg | INTRAVENOUS | Status: DC | PRN
  Administered 2024-06-20 – 2024-06-22 (×2): 20 mg via INTRAVENOUS
  Filled 2024-06-20 (×2): qty 4

## 2024-06-20 MED ORDER — DOCUSATE SODIUM 100 MG PO CAPS
100.0000 mg | ORAL_CAPSULE | Freq: Every day | ORAL | Status: DC
  Administered 2024-06-20 – 2024-06-24 (×5): 100 mg via ORAL
  Filled 2024-06-20 (×5): qty 1

## 2024-06-20 MED ORDER — RHO D IMMUNE GLOBULIN 1500 UNIT/2ML IJ SOSY: 300.0000 ug | PREFILLED_SYRINGE | Freq: Once | INTRAMUSCULAR | Status: DC

## 2024-06-20 MED ORDER — CYCLOBENZAPRINE HCL 10 MG PO TABS
10.0000 mg | ORAL_TABLET | Freq: Four times a day (QID) | ORAL | Status: DC | PRN
  Administered 2024-06-20 – 2024-06-21 (×4): 10 mg via ORAL
  Filled 2024-06-20 (×4): qty 1

## 2024-06-20 MED ORDER — NIFEDIPINE ER OSMOTIC RELEASE 30 MG PO TB24
30.0000 mg | ORAL_TABLET | Freq: Every day | ORAL | Status: DC
  Administered 2024-06-20: 30 mg via ORAL
  Filled 2024-06-20: qty 1
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
## 2024-06-20 NOTE — ED Notes (Signed)
 CCMD called, pt on monitor
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
## 2024-06-20 NOTE — Progress Notes (Signed)
 Pt in [facility]SC, Report given to Maggie, Charity fundraiser

## 2024-06-20 NOTE — Progress Notes (Signed)
 Pt has been cleared obstetrically, will begin to proceed with transferring to [facility]
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
## 2024-06-20 NOTE — Progress Notes (Signed)
 Dr Alger made aware of pt.  She is told of her elevated blood pressures and history of preeclampsia in the past. Dr Alger gives order for 20mg  of labetolol to be given now.  SHe also says pt will got to [facility] specialty care once she is cleared by ED.

## 2024-06-20 NOTE — ED Notes (Signed)
 CT called for scanner, no available scanner d/t 2 Code Strokes and 2 scanners currently down. Will call when a scanner becomes available.

## 2024-06-20 NOTE — Progress Notes (Signed)
 Marcelline Ahle [facility] charge nurse called and notified of pt.  Her plan is to put pt in room 102 once she is cleared in the ED.

## 2024-06-20 NOTE — Progress Notes (Signed)
 Rx for MFM dopplers  Bianco Cange, DO Attending Obstetrician & Gynecologist, [facility] for [employer], [facility]

## 2024-06-20 NOTE — Progress Notes (Signed)
 Dr Alger made aware of pts bp recheck, says that pt may go on preeclampsia protocol.  ED nurse made aware and he will give pt her next dose of labetalol  in between scans as she is leaving now for CT.

## 2024-06-21 ENCOUNTER — Inpatient Hospital Stay (HOSPITAL_COMMUNITY): Payer: MEDICAID

## 2024-06-21 DIAGNOSIS — I10 Essential (primary) hypertension: Secondary | ICD-10-CM

## 2024-06-21 DIAGNOSIS — I82890 Acute embolism and thrombosis of other specified veins: Secondary | ICD-10-CM | POA: Diagnosis present

## 2024-06-21 DIAGNOSIS — I82611 Acute embolism and thrombosis of superficial veins of right upper extremity: Secondary | ICD-10-CM

## 2024-06-21 DIAGNOSIS — I2699 Other pulmonary embolism without acute cor pulmonale: Secondary | ICD-10-CM

## 2024-06-21 DIAGNOSIS — M79601 Pain in right arm: Secondary | ICD-10-CM

## 2024-06-21 DIAGNOSIS — O119 Pre-existing hypertension with pre-eclampsia, unspecified trimester: Secondary | ICD-10-CM | POA: Diagnosis present

## 2024-06-21 LAB — CBC
HCT: 36.4 % (ref 36.0–46.0)
Hemoglobin: 11.7 g/dL — ABNORMAL LOW (ref 12.0–15.0)
MCH: 27 pg (ref 26.0–34.0)
MCHC: 32.1 g/dL (ref 30.0–36.0)
MCV: 83.9 fL (ref 80.0–100.0)
Platelets: 353 K/uL (ref 150–400)
RBC: 4.34 MIL/uL (ref 3.87–5.11)
RDW: 16.1 % — ABNORMAL HIGH (ref 11.5–15.5)
WBC: 14.5 K/uL — ABNORMAL HIGH (ref 4.0–10.5)
nRBC: 0 % (ref 0.0–0.2)

## 2024-06-21 LAB — COMPREHENSIVE METABOLIC PANEL WITH GFR
ALT: 12 U/L (ref 0–44)
AST: 15 U/L (ref 15–41)
Albumin: 2.4 g/dL — ABNORMAL LOW (ref 3.5–5.0)
Alkaline Phosphatase: 84 U/L (ref 38–126)
Anion gap: 11 (ref 5–15)
BUN: 12 mg/dL (ref 6–20)
CO2: 20 mmol/L — ABNORMAL LOW (ref 22–32)
Calcium: 8.6 mg/dL — ABNORMAL LOW (ref 8.9–10.3)
Chloride: 106 mmol/L (ref 98–111)
Creatinine, Ser: 0.65 mg/dL (ref 0.44–1.00)
GFR, Estimated: >60 mL/min (ref >60)
Glucose, Bld: 111 mg/dL — ABNORMAL HIGH (ref 70–99)
Potassium: 3.1 mmol/L — ABNORMAL LOW (ref 3.5–5.1)
Sodium: 137 mmol/L (ref 135–145)
Total Bilirubin: 0.2 mg/dL (ref 0.0–1.2)
Total Protein: 5.8 g/dL — ABNORMAL LOW (ref 6.5–8.1)

## 2024-06-21 LAB — PROTEIN / CREATININE RATIO, URINE
Creatinine, Urine: 173 mg/dL
Protein Creatinine Ratio: 0.64 mg/mg{creat} — ABNORMAL HIGH (ref 0.00–0.15)
Total Protein, Urine: 111 mg/dL

## 2024-06-21 LAB — CBC WITH DIFFERENTIAL/PLATELET
Abs Immature Granulocytes: 0.07 K/uL (ref 0.00–0.07)
Basophils Absolute: 0.1 K/uL (ref 0.0–0.1)
Basophils Relative: 0 %
Eosinophils Absolute: 0.1 K/uL (ref 0.0–0.5)
Eosinophils Relative: 1 %
HCT: 35 % — ABNORMAL LOW (ref 36.0–46.0)
Hemoglobin: 11.5 g/dL — ABNORMAL LOW (ref 12.0–15.0)
Immature Granulocytes: 1 %
Lymphocytes Relative: 30 %
Lymphs Abs: 4.6 K/uL — ABNORMAL HIGH (ref 0.7–4.0)
MCH: 27.4 pg (ref 26.0–34.0)
MCHC: 32.9 g/dL (ref 30.0–36.0)
MCV: 83.5 fL (ref 80.0–100.0)
Monocytes Absolute: 1.2 K/uL — ABNORMAL HIGH (ref 0.1–1.0)
Monocytes Relative: 8 %
Neutro Abs: 9.2 K/uL — ABNORMAL HIGH (ref 1.7–7.7)
Neutrophils Relative %: 60 %
Platelets: 323 K/uL (ref 150–400)
RBC: 4.19 MIL/uL (ref 3.87–5.11)
RDW: 16.5 % — ABNORMAL HIGH (ref 11.5–15.5)
WBC: 15.1 K/uL — ABNORMAL HIGH (ref 4.0–10.5)
nRBC: 0 % (ref 0.0–0.2)

## 2024-06-21 LAB — MAGNESIUM: Magnesium: 1.9 mg/dL (ref 1.7–2.4)

## 2024-06-21 LAB — LACTIC ACID, PLASMA
Lactic Acid, Venous: 2.4 mmol/L (ref 0.5–1.9)
Lactic Acid, Venous: 1.3 mmol/L (ref 0.5–1.9)

## 2024-06-21 LAB — RH IG WORKUP (INCLUDES ABO/RH)
Gestational Age(Wks): 28
Unit division: 0

## 2024-06-21 LAB — BRAIN NATRIURETIC PEPTIDE: B Natriuretic Peptide: 18.2 pg/mL (ref 0.0–100.0)

## 2024-06-21 LAB — D-DIMER, QUANTITATIVE: D-Dimer, Quant: 1.61 ug{FEU}/mL — ABNORMAL HIGH (ref 0.00–0.50)

## 2024-06-21 MED ORDER — INDOMETHACIN 25 MG PO CAPS: 50.0000 mg | ORAL_CAPSULE | Freq: Three times a day (TID) | ORAL | Status: DC

## 2024-06-21 MED ORDER — POTASSIUM CHLORIDE CRYS ER 20 MEQ PO TBCR
20.0000 meq | EXTENDED_RELEASE_TABLET | Freq: Two times a day (BID) | ORAL | Status: AC
  Administered 2024-06-21 – 2024-06-23 (×6): 20 meq via ORAL
  Filled 2024-06-21 (×6): qty 1

## 2024-06-21 MED ORDER — ENOXAPARIN SODIUM 120 MG/0.8ML IJ SOSY
1.0000 mg/kg | PREFILLED_SYRINGE | Freq: Two times a day (BID) | INTRAMUSCULAR | Status: DC
  Administered 2024-06-21 – 2024-06-24 (×7): 108 mg via SUBCUTANEOUS
  Filled 2024-06-21 (×9): qty 0.72

## 2024-06-21 MED ORDER — HYDROXYZINE HCL 10 MG PO TABS: 10.0000 mg | ORAL_TABLET | Freq: Three times a day (TID) | ORAL | Status: DC | PRN

## 2024-06-21 MED ORDER — BACITRACIN ZINC 500 UNIT/GM EX OINT
TOPICAL_OINTMENT | Freq: Two times a day (BID) | CUTANEOUS | Status: DC
  Administered 2024-06-22: 31.5 via TOPICAL
  Administered 2024-06-22: 1 via TOPICAL
  Administered 2024-06-23 – 2024-06-24 (×3): 31.5 via TOPICAL
  Filled 2024-06-21: qty 28.4

## 2024-06-21 NOTE — Plan of Care (Signed)
  Problem: Education: Goal: Knowledge of disease or condition will improve Outcome: Progressing Goal: Knowledge of the prescribed therapeutic regimen will improve Outcome: Progressing Goal: Individualized Educational Video(s) Outcome: Progressing   Problem: Clinical Measurements: Goal: Complications related to the disease process, condition or treatment will be avoided or minimized Outcome: Progressing   Problem: Education: Goal: Knowledge of disease or condition will improve Outcome: Progressing Goal: Knowledge of the prescribed therapeutic regimen will improve Outcome: Progressing   Problem: Fluid Volume: Goal: Peripheral tissue perfusion will improve Outcome: Progressing   Problem: Clinical Measurements: Goal: Complications related to disease process, condition or treatment will be avoided or minimized Outcome: Progressing   Problem: Education: Goal: Knowledge of General Education information will improve Description: Including pain rating scale, medication(s)/side effects and non-pharmacologic comfort measures Outcome: Progressing   Problem: Health Behavior/Discharge Planning: Goal: Ability to manage health-related needs will improve Outcome: Progressing   Problem: Clinical Measurements: Goal: Ability to maintain clinical measurements within normal limits will improve Outcome: Progressing Goal: Will remain free from infection Outcome: Progressing Goal: Diagnostic test results will improve Outcome: Progressing Goal: Respiratory complications will improve Outcome: Progressing Goal: Cardiovascular complication will be avoided Outcome: Progressing   Problem: Activity: Goal: Risk for activity intolerance will decrease Outcome: Progressing   Problem: Nutrition: Goal: Adequate nutrition will be maintained Outcome: Progressing   Problem: Coping: Goal: Level of anxiety will decrease Outcome: Progressing   Problem: Elimination: Goal: Will not experience  complications related to bowel motility Outcome: Progressing Goal: Will not experience complications related to urinary retention Outcome: Progressing   Problem: Pain Managment: Goal: General experience of comfort will improve and/or be controlled Outcome: Progressing   Problem: Safety: Goal: Ability to remain free from injury will improve Outcome: Progressing   Problem: Skin Integrity: Goal: Risk for impaired skin integrity will decrease Outcome: Progressing

## 2024-06-21 NOTE — Significant Event (Addendum)
 Rapid Response Event Note   Reason for Call :  Tachycardia  Initial Focused Assessment:  Pt in bed, AO. Pale, warm. Reports chest pain, at the center of her chest, worsens with palpation. Tachypnea noted at rest, patient reports dysnea with ambulation. As well as, increased in abdominal pain and chest pain with ambulation. Clear breath sounds. Swelling noted to bilateral UEs. RUE positive for superficial vein thrombosis. EKG this AM showing sinus tachycardia and T wave changes.   VS: T 97.7F, BP 147/81, HR 132, RR 22, SpO2 97% on room air  Interventions:  -TRH consulted and new orders in place: CXR, D-dimer, pharmacy consult for Lovenox , CBC, ECHO  Plan of Care:  -Cardiac telemetry & continuous SpO2 monitoring  Event Summary:  MD Notified: Dr. Seena Call Time:  1226 Arrival Time: 1230 End Time: 1300  Erica LITTIE Danker, RN

## 2024-06-21 NOTE — Progress Notes (Signed)
 Pt called out c/o difficulty breathing. Lung fields equal and clear, O2 sat 97%. Tachycardic at 150-170 increasing with movement. Dr. Nicholaus notified and at bedside. Rapid Response RN to see pt. Telemetry orders received.

## 2024-06-21 NOTE — Progress Notes (Signed)
 Patient ID: Erica Colon, female   DOB: 08-17-2001, 23 y.o.   MRN: [id number] Right arm imaging showed superficial venous thrombosis no DVT. I added oral indomethacin  for inflammation and pain relief.  Eveline Lynwood MATSU, MD

## 2024-06-21 NOTE — Progress Notes (Signed)
 This Charge RN was asked to assess patient due to tachycardia and patient feeling unwell. On assessment, patient lying in bed and states that she is dizzy, has blurry vision, and is feeling intermittent body shakes. Patient is afebrile, BP 150s/90s, HR 130, SpO2 97%. Last medication administration was Flexeril  10 mg Oral at 0359. Patient appears pale, diaphoretic, drowsy. Right hand 2+ pitting edema below IV site. Patient has decreased range of motion and grip strength in right hand. MD in procedure. Due to change in Neurologic status and acute patient presentation, Charge and bedside RN asked Olam Gerold NP from [facility] to assess patient. NP to bedside at this time. Orders for serum CMP, BNP, Lactic acid, as well as 12-lead EKG.  Benita LITTIE Re
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
## 2024-06-21 NOTE — Progress Notes (Signed)
 MD notified at 0200 of critical lab result of lactid acid of 2.4. No new orders placed at this time. On RN's assessment patient was afebrile, patient was alert and oriented.

## 2024-06-21 NOTE — Progress Notes (Signed)
 VASCULAR LAB    Right upper extremity venous duplex has been performed.  See CV proc for preliminary results.   Aloys Hupfer, RVT 06/21/2024, 10:39 AM
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
## 2024-06-21 NOTE — Progress Notes (Signed)
 MD notified via secure chat of new assessment findings of right hand/arm swelling below IV location of IV. Decreased grip strength in right hand and ROM. RN discontinued IV fluids at this time.
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
## 2024-06-22 ENCOUNTER — Inpatient Hospital Stay (HOSPITAL_COMMUNITY): Payer: MEDICAID

## 2024-06-22 DIAGNOSIS — R0609 Other forms of dyspnea: Secondary | ICD-10-CM

## 2024-06-22 DIAGNOSIS — S3991XA Unspecified injury of abdomen, initial encounter: Secondary | ICD-10-CM

## 2024-06-22 LAB — COMPREHENSIVE METABOLIC PANEL WITH GFR
ALT: 13 U/L (ref 0–44)
AST: 17 U/L (ref 15–41)
Albumin: 2.6 g/dL — ABNORMAL LOW (ref 3.5–5.0)
Alkaline Phosphatase: 99 U/L (ref 38–126)
Anion gap: 7 (ref 5–15)
BUN: 11 mg/dL (ref 6–20)
CO2: 20 mmol/L — ABNORMAL LOW (ref 22–32)
Calcium: 9.4 mg/dL (ref 8.9–10.3)
Chloride: 106 mmol/L (ref 98–111)
Creatinine, Ser: 0.72 mg/dL (ref 0.44–1.00)
GFR, Estimated: >60 mL/min (ref >60)
Glucose, Bld: 90 mg/dL (ref 70–99)
Potassium: 4.2 mmol/L (ref 3.5–5.1)
Sodium: 133 mmol/L — ABNORMAL LOW (ref 135–145)
Total Bilirubin: 0.4 mg/dL (ref 0.0–1.2)
Total Protein: 6.2 g/dL — ABNORMAL LOW (ref 6.5–8.1)

## 2024-06-22 LAB — CBC
HCT: 37.3 % (ref 36.0–46.0)
Hemoglobin: 12.2 g/dL (ref 12.0–15.0)
MCH: 27.5 pg (ref 26.0–34.0)
MCHC: 32.7 g/dL (ref 30.0–36.0)
MCV: 84.2 fL (ref 80.0–100.0)
Platelets: 371 K/uL (ref 150–400)
RBC: 4.43 MIL/uL (ref 3.87–5.11)
RDW: 16.1 % — ABNORMAL HIGH (ref 11.5–15.5)
WBC: 14.9 K/uL — ABNORMAL HIGH (ref 4.0–10.5)
nRBC: 0 % (ref 0.0–0.2)

## 2024-06-22 LAB — TYPE AND SCREEN
ABO/RH(D): A NEG
Antibody Screen: POSITIVE

## 2024-06-22 LAB — CULTURE, OB URINE

## 2024-06-22 LAB — ECHOCARDIOGRAM COMPLETE
Height: 67.2 in
S' Lateral: 1.9 cm
Weight: 3774.4 [oz_av]

## 2024-06-22 MED ORDER — LABETALOL HCL 200 MG PO TABS
200.0000 mg | ORAL_TABLET | Freq: Two times a day (BID) | ORAL | Status: DC
  Administered 2024-06-22: 200 mg via ORAL
  Filled 2024-06-22: qty 1

## 2024-06-22 NOTE — Plan of Care (Signed)
  Problem: Education: Goal: Knowledge of disease or condition will improve Outcome: Progressing Goal: Knowledge of the prescribed therapeutic regimen will improve Outcome: Progressing Goal: Individualized Educational Video(s) Outcome: Progressing   Problem: Clinical Measurements: Goal: Complications related to the disease process, condition or treatment will be avoided or minimized Outcome: Progressing   Problem: Education: Goal: Knowledge of disease or condition will improve Outcome: Progressing Goal: Knowledge of the prescribed therapeutic regimen will improve Outcome: Progressing   Problem: Fluid Volume: Goal: Peripheral tissue perfusion will improve Outcome: Progressing   Problem: Clinical Measurements: Goal: Complications related to disease process, condition or treatment will be avoided or minimized Outcome: Progressing   Problem: Education: Goal: Knowledge of General Education information will improve Description: Including pain rating scale, medication(s)/side effects and non-pharmacologic comfort measures Outcome: Progressing   Problem: Health Behavior/Discharge Planning: Goal: Ability to manage health-related needs will improve Outcome: Progressing   Problem: Clinical Measurements: Goal: Ability to maintain clinical measurements within normal limits will improve Outcome: Progressing Goal: Will remain free from infection Outcome: Progressing Goal: Diagnostic test results will improve Outcome: Progressing Goal: Respiratory complications will improve Outcome: Progressing Goal: Cardiovascular complication will be avoided Outcome: Progressing   Problem: Activity: Goal: Risk for activity intolerance will decrease Outcome: Progressing   Problem: Nutrition: Goal: Adequate nutrition will be maintained Outcome: Progressing   Problem: Coping: Goal: Level of anxiety will decrease Outcome: Progressing   Problem: Elimination: Goal: Will not experience  complications related to bowel motility Outcome: Progressing Goal: Will not experience complications related to urinary retention Outcome: Progressing   Problem: Pain Managment: Goal: General experience of comfort will improve and/or be controlled Outcome: Progressing   Problem: Safety: Goal: Ability to remain free from injury will improve Outcome: Progressing   Problem: Skin Integrity: Goal: Risk for impaired skin integrity will decrease Outcome: Progressing

## 2024-06-22 NOTE — Progress Notes (Signed)
 Reviewed course with patient, she is feeling better. Still sore but states she is feeling okay. Normal fetal movement. No other complaints.  Will add labetalol  200 mg BID for elevated BP. Encouraged use of incentive spirometer.  Possibly home tomorrow.   LOIS Yolanda Moats, MD, [facility] Attending Center for [facility] [place])
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
## 2024-06-22 NOTE — Progress Notes (Signed)
 Echocardiogram 2D Echocardiogram has been performed.  Tinnie FORBES Gosling RDCS 06/22/2024, 9:28 AM

## 2024-06-22 NOTE — TOC CAGE-AID Note (Signed)
 Transition of Care [facility]) - CAGE-AID Screening  Patient Details  Name: Erica Colon MRN: [id number] Date of Birth: Jan 04, 2001  Clinical Narrative:  Patient denies any current alcohol or drug use, denies need for substance abuse resources at this time.  CAGE-AID Screening:   Have You Ever Felt You Ought to Cut Down on Your Drinking or Drug Use?: No Have People Annoyed You By Critizing Your Drinking Or Drug Use?: No Have You Felt Bad Or Guilty About Your Drinking Or Drug Use?: No Have You Ever Had a Drink or Used Drugs First Thing In The Morning to Steady Your Nerves or to Get Rid of a Hangover?: No CAGE-AID Score: 0  Substance Abuse Education Offered: No

## 2024-06-23 ENCOUNTER — Other Ambulatory Visit

## 2024-06-23 DIAGNOSIS — Z3A28 28 weeks gestation of pregnancy: Secondary | ICD-10-CM

## 2024-06-23 LAB — CBC
HCT: 41.9 % (ref 36.0–46.0)
Hematocrit: 45.3 % (ref 34.0–46.6)
Hemoglobin: 14.2 g/dL (ref 11.1–15.9)
Hemoglobin: 13.6 g/dL (ref 12.0–15.0)
MCH: 27.2 pg (ref 26.6–33.0)
MCH: 27.4 pg (ref 26.0–34.0)
MCHC: 31.3 g/dL — ABNORMAL LOW (ref 31.5–35.7)
MCHC: 32.5 g/dL (ref 30.0–36.0)
MCV: 87 fL (ref 79–97)
MCV: 84.3 fL (ref 80.0–100.0)
Platelets: 373 x10E3/uL (ref 150–450)
Platelets: 358 K/uL (ref 150–400)
RBC: 5.23 x10E6/uL (ref 3.77–5.28)
RBC: 4.97 MIL/uL (ref 3.87–5.11)
RDW: 15.3 % (ref 11.7–15.4)
RDW: 16.5 % — ABNORMAL HIGH (ref 11.5–15.5)
WBC: 16.4 x10E3/uL — ABNORMAL HIGH (ref 3.4–10.8)
WBC: 13.1 K/uL — ABNORMAL HIGH (ref 4.0–10.5)
nRBC: 0 % (ref 0.0–0.2)

## 2024-06-23 LAB — WET PREP, GENITAL
Clue Cells Wet Prep HPF POC: NONE SEEN
Sperm: NONE SEEN
Trich, Wet Prep: NONE SEEN
WBC, Wet Prep HPF POC: <10 (ref <10)
Yeast Wet Prep HPF POC: NONE SEEN

## 2024-06-23 LAB — COMPREHENSIVE METABOLIC PANEL WITH GFR
ALT: 13 U/L (ref 0–44)
AST: 19 U/L (ref 15–41)
Albumin: 2.7 g/dL — ABNORMAL LOW (ref 3.5–5.0)
Alkaline Phosphatase: 100 U/L (ref 38–126)
Anion gap: 14 (ref 5–15)
BUN: 14 mg/dL (ref 6–20)
CO2: 20 mmol/L — ABNORMAL LOW (ref 22–32)
Calcium: 9.1 mg/dL (ref 8.9–10.3)
Chloride: 105 mmol/L (ref 98–111)
Creatinine, Ser: 0.67 mg/dL (ref 0.44–1.00)
GFR, Estimated: >60 mL/min (ref >60)
Glucose, Bld: 87 mg/dL (ref 70–99)
Potassium: 3.8 mmol/L (ref 3.5–5.1)
Sodium: 139 mmol/L (ref 135–145)
Total Bilirubin: 0.2 mg/dL (ref 0.0–1.2)
Total Protein: 6.5 g/dL (ref 6.5–8.1)

## 2024-06-23 LAB — AB SCR+ANTIBODY ID: Antibody Screen: POSITIVE — AB

## 2024-06-23 LAB — ANTIBODY SCREEN

## 2024-06-23 LAB — TYPE AND SCREEN
ABO/RH(D): A NEG
Antibody Screen: POSITIVE

## 2024-06-23 LAB — HIV ANTIBODY (ROUTINE TESTING W REFLEX): HIV Screen 4th Generation wRfx: NONREACTIVE

## 2024-06-23 LAB — GLUCOSE TOLERANCE, 2 HOURS W/ 1HR
Glucose, 1 hour: 88 mg/dL (ref 70–179)
Glucose, 2 hour: 86 mg/dL (ref 70–152)
Glucose, Fasting: 75 mg/dL (ref 70–91)

## 2024-06-23 LAB — RPR: RPR Ser Ql: NONREACTIVE

## 2024-06-23 MED ORDER — LABETALOL HCL 200 MG PO TABS
400.0000 mg | ORAL_TABLET | Freq: Two times a day (BID) | ORAL | Status: DC
Start: 2024-06-23 — End: 2024-06-24
  Administered 2024-06-23 – 2024-06-24 (×3): 400 mg via ORAL
  Filled 2024-06-23 (×3): qty 2
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
## 2024-06-24 ENCOUNTER — Other Ambulatory Visit (HOSPITAL_COMMUNITY): Payer: Self-pay

## 2024-06-24 ENCOUNTER — Ambulatory Visit

## 2024-06-24 DIAGNOSIS — O99210 Obesity complicating pregnancy, unspecified trimester: Secondary | ICD-10-CM | POA: Diagnosis present

## 2024-06-24 DIAGNOSIS — O23593 Infection of other part of genital tract in pregnancy, third trimester: Secondary | ICD-10-CM | POA: Diagnosis not present

## 2024-06-24 DIAGNOSIS — O88213 Thromboembolism in pregnancy, third trimester: Secondary | ICD-10-CM | POA: Diagnosis not present

## 2024-06-24 LAB — COMPREHENSIVE METABOLIC PANEL WITH GFR
ALT: 15 U/L (ref 0–44)
AST: 18 U/L (ref 15–41)
Albumin: 2.2 g/dL — ABNORMAL LOW (ref 3.5–5.0)
Alkaline Phosphatase: 81 U/L (ref 38–126)
Anion gap: 9 (ref 5–15)
BUN: 17 mg/dL (ref 6–20)
CO2: 20 mmol/L — ABNORMAL LOW (ref 22–32)
Calcium: 8.4 mg/dL — ABNORMAL LOW (ref 8.9–10.3)
Chloride: 106 mmol/L (ref 98–111)
Creatinine, Ser: 0.6 mg/dL (ref 0.44–1.00)
GFR, Estimated: >60 mL/min (ref >60)
Glucose, Bld: 73 mg/dL (ref 70–99)
Potassium: 3.8 mmol/L (ref 3.5–5.1)
Sodium: 135 mmol/L (ref 135–145)
Total Bilirubin: 0.2 mg/dL (ref 0.0–1.2)
Total Protein: 5.5 g/dL — ABNORMAL LOW (ref 6.5–8.1)

## 2024-06-24 LAB — CBC
HCT: 35.7 % — ABNORMAL LOW (ref 36.0–46.0)
Hemoglobin: 11.7 g/dL — ABNORMAL LOW (ref 12.0–15.0)
MCH: 27.3 pg (ref 26.0–34.0)
MCHC: 32.8 g/dL (ref 30.0–36.0)
MCV: 83.2 fL (ref 80.0–100.0)
Platelets: 335 K/uL (ref 150–400)
RBC: 4.29 MIL/uL (ref 3.87–5.11)
RDW: 16.1 % — ABNORMAL HIGH (ref 11.5–15.5)
WBC: 13 K/uL — ABNORMAL HIGH (ref 4.0–10.5)
nRBC: 0 % (ref 0.0–0.2)

## 2024-06-24 LAB — HEPARIN ANTI-XA: Heparin LMW: 0.69 [IU]/mL

## 2024-06-24 MED ORDER — ENOXAPARIN SODIUM 120 MG/0.8ML IJ SOSY
1.0000 mg/kg | PREFILLED_SYRINGE | Freq: Two times a day (BID) | INTRAMUSCULAR | 0 refills | Status: DC
  Filled 2024-06-24: qty 32, 20d supply, fill #0

## 2024-06-24 MED ORDER — LABETALOL HCL 200 MG PO TABS
400.0000 mg | ORAL_TABLET | Freq: Two times a day (BID) | ORAL | 2 refills | Status: DC
  Filled 2024-06-24: qty 120, 30d supply, fill #0

## 2024-06-24 MED ORDER — NIFEDIPINE ER 60 MG PO TB24
60.0000 mg | ORAL_TABLET | Freq: Two times a day (BID) | ORAL | 1 refills | Status: DC
  Filled 2024-06-24: qty 60, 30d supply, fill #0

## 2024-06-24 MED ORDER — ACETAMINOPHEN 500 MG PO TABS
1000.0000 mg | ORAL_TABLET | Freq: Four times a day (QID) | ORAL | 0 refills | Status: DC | PRN
  Filled 2024-06-24: qty 100, 13d supply, fill #0

## 2024-06-24 MED ORDER — FLUCONAZOLE 150 MG PO TABS
150.0000 mg | ORAL_TABLET | Freq: Once | ORAL | Status: AC
  Administered 2024-06-24: 150 mg via ORAL
  Filled 2024-06-24: qty 1

## 2024-06-24 MED ORDER — CYCLOBENZAPRINE HCL 10 MG PO TABS
10.0000 mg | ORAL_TABLET | Freq: Four times a day (QID) | ORAL | 0 refills | Status: AC | PRN
  Filled 2024-06-24: qty 30, 8d supply, fill #0

## 2024-06-24 MED ORDER — HYDROXYZINE HCL 10 MG PO TABS
10.0000 mg | ORAL_TABLET | Freq: Three times a day (TID) | ORAL | 0 refills | Status: AC | PRN
  Filled 2024-06-24: qty 30, 10d supply, fill #0

## 2024-06-24 MED ORDER — BACITRACIN ZINC 500 UNIT/GM EX OINT
TOPICAL_OINTMENT | Freq: Two times a day (BID) | CUTANEOUS | 0 refills | Status: DC
  Filled 2024-06-24: qty 28.4, 7d supply, fill #0

## 2024-06-24 NOTE — Discharge Summary (Signed)
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                                                                                                      COMMENTS GROSS BODY MOVEMENT                 2  TONE                2  RESPIRATIONS                2  AMNIOTIC FLUID                2                                                          SCORE:  8/8 (Note: NST was not performed as part of this antepartum testing)  DOPPLER FLOW STUDIES: UMBILICAL ARTERY RI RATIOS:    .63,.70,.74=64% ANATOMICAL SURVEY                                                                            COMMENTS CEREBRAL VENTRICLES yes normal  CHOROID PLEXUS yes normal  CEREBELLUM yes normal  CISTERNA MAGNA  Yes  normal   CAVUM SEPTI PELLUCIDI YES NORMAL              NOSE/LIP yes normal  FACIAL PROFILE yes normal  4 CHAMBERED HEART yes normal  OUTFLOW TRACTS YES normaL  3VV YES NORMAL  3VTV YES NORMAL  SITUS YES NORMAL      DIAPHRAGM yes normal  STOMACH yes normal  RENAL REGION yes normal  BLADDER yes normal  PLACENTA CORD INSERTION Yes   Normal   ABDOMINAL CORD INSERTION YES NORMAL  3 VESSEL CORD yes normal  SPINE no  Limited view  ARMS/HANDS yes normal Need left hand      GENITALIA yes normal female     SUSPECTED ABNORMALITIES:  yes QUALITY OF SCAN: Limited view TECHNICIAN COMMENTS: US  28+1 wks,cephalic,anterior placenta gr 1,BPP 8/8,RI .63,.70,.74=64%,EFW 917 g 3%,AC 2%,AFI 14 cm,limited view of spine,left arm visualized,unable to visualize left hand because of position,FHR 135 bpm A copy of this report including all images has been saved and  backed up to a second source for retrieval if needed. All measures and details of the anatomical scan, placentation, fluid volume and pelvic anatomy are contained in that report. Amber JINNY Pitts 06/19/2024 4:22 PM Clinical Impression and recommendations: I have reviewed the sonogram results above, combined with the patient's current clinical course, below are my impressions and any  appropriate recommendations for management based on the sonographic findings. 1.  [id number] Estimated Date of Delivery: 09/10/24 by serial sonographic evaluations 2.  Fetal sonographic surveillance findings: a). Normal fluid volume b). Normal antepartum fetal assessment with BPP 8/8 c). Normal fetal Doppler ratios with consistent diastolic flow:  64% d). Suspected fetal growth restriction noted, current interval growth:  3%, AC 2% 3.  Normal general sonographic findings Recommend antepartum testing including doppler studies every 1-2 weeks and serial growth scans.  Recommend continued prenatal evaluations and care based on this sonogram and as clinically indicated from the patient's clinical course. Delon CHRISTELLA Prude 06/19/2024 8:52 PM     Future Appointments  Date Time Provider Department Center  06/26/2024  2:45 PM [facility] PROVIDER 1 [facility]  06/26/2024  3:00 PM [facility] US1 [facility]  06/27/2024 10:10 AM Prude Delon, DO [facility]  07/03/2024  2:45 PM [facility] PROVIDER 1 [facility]  07/03/2024  3:00 PM [facility] US5 [facility]  07/04/2024  8:50 AM Ozan, Jennifer, DO [facility]  07/10/2024  2:45 PM [facility] PROVIDER 1 [facility]  07/10/2024  3:00 PM [facility] US1 [facility]  07/11/2024  8:50 AM Ozan, Jennifer, DO [facility]  07/17/2024  3:00 PM [facility] US  [facility] None  07/17/2024  3:50 PM Jayne Vonn DEL, MD [facility]  07/21/2024  3:30 PM [facility] NURSE [facility]  07/24/2024 10:45 AM [facility] US  [facility] None  07/24/2024 11:30 AM Kizzie Suzen SAUNDERS, CNM [facility]  07/28/2024  3:30 PM [facility] NURSE [facility]  07/31/2024  3:00 PM [facility] US  [facility] None  07/31/2024  3:50 PM Prude Delon, DO [facility]  08/04/2024  3:30 PM [facility] NURSE [facility]  08/07/2024  3:00 PM [facility] US  [facility] None  08/07/2024  3:50 PM Prude Delon, DO [facility]  08/11/2024  3:30 PM [facility] NURSE [facility]  08/14/2024  2:15  PM [facility] US  [facility] None  08/14/2024  3:10 PM Prude Delon, DO [facility]  08/18/2024  3:30 PM [facility] NURSE [facility]  08/21/2024  3:00 PM [facility] US  [facility] None  08/21/2024  3:50 PM Prude Delon, DO [facility]  08/25/2024  3:30 PM [facility] NURSE [facility]  08/28/2024  3:00 PM [facility] US  [facility] None  08/28/2024  3:50 PM Prude Delon, DO [facility]  09/01/2024  3:00 PM [facility] IMG 2 [facility] None  09/01/2024  3:50 PM [facility] NURSE [facility]  09/01/2024  4:10 PM Jayne Vonn DEL, MD [facility]  09/08/2024  3:30 PM [facility] NURSE [facility]    Discharge Condition: Stable  Discharge disposition: 01-Home or Self Care        Allergies as of 06/24/2024   No Known Allergies      Medication List     TAKE these medications    Acetaminophen  Extra Strength 500 MG Tabs Take 2 tablets (1,000 mg total) by mouth every 6 (six) hours as needed for moderate pain (pain score 4-6) (for pain scale < 4  OR  temperature  >/=  100.5 F).   aspirin  81 MG  chewable tablet Chew 2 tablets (162 mg total) by mouth daily.   Blood Pressure Monitor Misc For regular home bp monitoring during pregnancy   cyclobenzaprine  10 MG tablet Commonly known as: FLEXERIL  Take 1 tablet (10 mg total) by mouth every 6 (six) hours as needed for muscle spasms.   enoxaparin  120 MG/0.8ML injection Commonly known as: LOVENOX  Inject 0.72 mLs (108 mg total) into the skin every 12 (twelve) hours. Needs anticoagulation for remainder of pregnancy and at least six weeks postpartum.   FT Antibiotic ointment Generic drug: bacitracin  Apply topically 2 (two) times daily. Apply to abrasions   hydrOXYzine  10 MG tablet Commonly known as: ATARAX  Take 1 tablet (10 mg total) by mouth 3 (three) times daily as needed for anxiety.   labetalol  200 MG tablet Commonly known as: NORMODYNE  Take 2 tablets (400 mg total) by mouth 2 (two) times daily.    NIFEdipine  60 MG 24 hr tablet Commonly known as: ADALAT  CC Take 1 tablet (60 mg total) by mouth 2 (two) times daily. What changed:  medication strength how much to take when to take this   PRENATAL VITAMIN PO Take by mouth.         Total discharge time: 45 minutes   Signed: Gloris Hugger M.D. 06/24/2024, 11:43 AM

## 2024-06-24 NOTE — Progress Notes (Signed)
   06/24/24 1256  Departure Condition  Departure Condition Good  Mobility at [employer]  Patient/Caregiver Teaching Teach Back Method Used;Discharge instructions reviewed;Prescriptions reviewed;Follow-up care reviewed;Admission discussed;Pain management discussed;Medications discussed;Patient/caregiver verbalized understanding;Educated about hypertension in pregnancy  Departure Mode With family  Was procedural sedation performed on this patient during this visit? No   Patient alert and oriented x4, VS and pain stable.

## 2024-06-24 NOTE — Discharge Summary (Deleted)
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 WEIGHT:       917  grams, 3 % BIOPHYSICAL PROFILE:                                                                                                      COMMENTS GROSS BODY MOVEMENT                 2  TONE                2  RESPIRATIONS                2  AMNIOTIC FLUID                2                                                          SCORE:  8/8 (Note: NST was not performed as part of this antepartum testing)  DOPPLER FLOW STUDIES: UMBILICAL ARTERY RI RATIOS:    .63,.70,.74=64% ANATOMICAL SURVEY                                                                            COMMENTS CEREBRAL VENTRICLES yes normal  CHOROID PLEXUS yes normal  CEREBELLUM yes normal  CISTERNA MAGNA  Yes  normal   CAVUM SEPTI PELLUCIDI YES NORMAL              NOSE/LIP yes normal  FACIAL PROFILE yes normal  4 CHAMBERED HEART yes normal  OUTFLOW TRACTS YES normaL  3VV YES NORMAL  3VTV YES NORMAL  SITUS YES NORMAL      DIAPHRAGM yes normal  STOMACH yes normal  RENAL REGION yes normal  BLADDER yes normal  PLACENTA CORD INSERTION Yes   Normal   ABDOMINAL CORD INSERTION YES NORMAL  3 VESSEL CORD yes normal  SPINE no  Limited view  ARMS/HANDS yes normal Need left hand      GENITALIA yes normal female     SUSPECTED ABNORMALITIES:  yes QUALITY OF SCAN: Limited view TECHNICIAN COMMENTS: US  28+1 wks,cephalic,anterior placenta gr 1,BPP 8/8,RI .63,.70,.74=64%,EFW 917 g 3%,AC 2%,AFI 14 cm,limited view of spine,left arm visualized,unable to visualize left hand because of position,FHR 135 bpm A copy of this report including all images has been saved and  backed up to a second source for retrieval if needed. All measures and details of the anatomical scan, placentation, fluid volume and pelvic anatomy are contained in that report. Amber JINNY Pitts 06/19/2024 4:22 PM Clinical Impression and recommendations: I have reviewed the sonogram results above, combined with the patient's current clinical course, below are my impressions and any  appropriate recommendations for management based on the sonographic findings. 1.  [id number] Estimated Date of Delivery: 09/10/24 by serial sonographic evaluations 2.  Fetal sonographic surveillance findings: a). Normal fluid volume b). Normal antepartum fetal assessment with BPP 8/8 c). Normal fetal Doppler ratios with consistent diastolic flow:  64% d). Suspected fetal growth restriction noted, current interval growth:  3%, AC 2% 3.  Normal general sonographic findings Recommend antepartum testing including doppler studies every 1-2 weeks and serial growth scans.  Recommend continued prenatal evaluations and care based on this sonogram and as clinically indicated from the patient's clinical course. Delon CHRISTELLA Prude 06/19/2024 8:52 PM     Future Appointments  Date Time Provider Department Center  06/26/2024  2:45 PM [facility] PROVIDER 1 [facility]  06/26/2024  3:00 PM [facility] US1 [facility]  06/27/2024 10:10 AM Prude Delon, DO [facility]  07/03/2024  2:45 PM [facility] PROVIDER 1 [facility]  07/03/2024  3:00 PM [facility] US5 [facility]  07/04/2024  8:50 AM Prude Delon, DO [facility]  07/10/2024  2:45 PM [facility] PROVIDER 1 [facility]  07/10/2024  3:00 PM [facility] US1 [facility]  07/11/2024  8:50 AM Ozan, Jennifer, DO [facility]  07/17/2024  3:00 PM [facility] US  [facility] None  07/17/2024  3:50 PM Jayne Vonn DEL, MD [facility]  07/21/2024  3:30 PM [facility] NURSE [facility]  07/24/2024 10:45 AM [facility] US  [facility] None  07/24/2024 11:30 AM Kizzie Suzen SAUNDERS, CNM [facility]  07/28/2024  3:30 PM [facility] NURSE [facility]  07/31/2024  3:00 PM [facility] US  [facility] None  07/31/2024  3:50 PM Prude Delon, DO [facility]  08/04/2024  3:30 PM [facility] NURSE [facility]  08/07/2024  3:00 PM [facility] US  [facility] None  08/07/2024  3:50 PM Prude Delon, DO [facility]  08/11/2024  3:30 PM [facility] NURSE [facility]  08/14/2024  2:15  PM [facility] US  [facility] None  08/14/2024  3:10 PM Prude Delon, DO [facility]  08/18/2024  3:30 PM [facility] NURSE [facility]  08/21/2024  3:00 PM [facility] US  [facility] None  08/21/2024  3:50 PM Prude Delon, DO [facility]  08/25/2024  3:30 PM [facility] NURSE [facility]  08/28/2024  3:00 PM [facility] US  [facility] None  08/28/2024  3:50 PM Prude Delon, DO [facility]  09/01/2024  3:00 PM [facility] - FT IMG 2 [facility] None  09/01/2024  3:50 PM [facility] NURSE [facility]  09/01/2024  4:10 PM Jayne Vonn DEL, MD [facility]  09/08/2024  3:30 PM [facility] NURSE [facility]    Discharge Condition: Stable  Discharge disposition: 01-Home or Self Care        Allergies as of 06/24/2024   No Known Allergies      Medication List     TAKE these medications    Acetaminophen  Extra Strength 500 MG Tabs Take 2 tablets (1,000 mg total) by mouth every 6 (six) hours as needed for moderate pain (pain score 4-6) (for pain scale < 4  OR  temperature  >/=  100.5 F).   aspirin  81 MG  chewable tablet Chew 2 tablets (162 mg total) by mouth daily.   Blood Pressure Monitor Misc For regular home bp monitoring during pregnancy   cyclobenzaprine  10 MG tablet Commonly known as: FLEXERIL  Take 1 tablet (10 mg total) by mouth every 6 (six) hours as needed for muscle spasms.   enoxaparin  120 MG/0.8ML injection Commonly known as: LOVENOX  Inject 0.72 mLs (108 mg total) into the skin every 12 (twelve) hours. Needs anticoagulation for remainder of pregnancy and at least six weeks postpartum.   FT Antibiotic ointment Generic drug: bacitracin  Apply topically 2 (two) times daily. Apply to abrasions   hydrOXYzine  10 MG tablet Commonly known as: ATARAX  Take 1 tablet (10 mg total) by mouth 3 (three) times daily as needed for anxiety.   labetalol  200 MG tablet Commonly known as: NORMODYNE  Take 2 tablets (400 mg total) by mouth 2 (two) times daily.    NIFEdipine  60 MG 24 hr tablet Commonly known as: ADALAT  CC Take 1 tablet (60 mg total) by mouth 2 (two) times daily. What changed:  medication strength how much to take when to take this   PRENATAL VITAMIN PO Take by mouth.         Total discharge time: 45 minutes   Signed: Gloris Hugger M.D. 06/24/2024, 11:40 AM
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
## 2024-06-24 NOTE — Plan of Care (Signed)
  Problem: Education: Goal: Knowledge of disease or condition will improve Outcome: Adequate for Discharge Goal: Knowledge of the prescribed therapeutic regimen will improve Outcome: Adequate for Discharge Goal: Individualized Educational Video(s) Outcome: Adequate for Discharge   Problem: Clinical Measurements: Goal: Complications related to the disease process, condition or treatment will be avoided or minimized Outcome: Adequate for Discharge   Problem: Education: Goal: Knowledge of disease or condition will improve Outcome: Adequate for Discharge Goal: Knowledge of the prescribed therapeutic regimen will improve Outcome: Adequate for Discharge   Problem: Fluid Volume: Goal: Peripheral tissue perfusion will improve Outcome: Adequate for Discharge   Problem: Clinical Measurements: Goal: Complications related to disease process, condition or treatment will be avoided or minimized Outcome: Adequate for Discharge   Problem: Education: Goal: Knowledge of General Education information will improve Description: Including pain rating scale, medication(s)/side effects and non-pharmacologic comfort measures Outcome: Adequate for Discharge   Problem: Health Behavior/Discharge Planning: Goal: Ability to manage health-related needs will improve Outcome: Adequate for Discharge   Problem: Clinical Measurements: Goal: Ability to maintain clinical measurements within normal limits will improve Outcome: Adequate for Discharge Goal: Will remain free from infection Outcome: Adequate for Discharge Goal: Diagnostic test results will improve Outcome: Adequate for Discharge Goal: Respiratory complications will improve Outcome: Adequate for Discharge Goal: Cardiovascular complication will be avoided Outcome: Adequate for Discharge   Problem: Activity: Goal: Risk for activity intolerance will decrease Outcome: Adequate for Discharge   Problem: Nutrition: Goal: Adequate nutrition will be  maintained Outcome: Adequate for Discharge   Problem: Coping: Goal: Level of anxiety will decrease Outcome: Adequate for Discharge   Problem: Elimination: Goal: Will not experience complications related to bowel motility Outcome: Adequate for Discharge Goal: Will not experience complications related to urinary retention Outcome: Adequate for Discharge   Problem: Pain Managment: Goal: General experience of comfort will improve and/or be controlled Outcome: Adequate for Discharge   Problem: Safety: Goal: Ability to remain free from injury will improve Outcome: Adequate for Discharge   Problem: Skin Integrity: Goal: Risk for impaired skin integrity will decrease Outcome: Adequate for Discharge

## 2024-06-26 ENCOUNTER — Ambulatory Visit (HOSPITAL_BASED_OUTPATIENT_CLINIC_OR_DEPARTMENT_OTHER): Payer: Self-pay | Admitting: Obstetrics

## 2024-06-26 ENCOUNTER — Other Ambulatory Visit: Payer: Self-pay | Admitting: Obstetrics & Gynecology

## 2024-06-26 ENCOUNTER — Ambulatory Visit: Payer: Self-pay | Attending: Neurology

## 2024-06-26 ENCOUNTER — Other Ambulatory Visit

## 2024-06-26 VITALS — BP 133/81 | HR 100

## 2024-06-26 DIAGNOSIS — Z3A29 29 weeks gestation of pregnancy: Secondary | ICD-10-CM | POA: Insufficient documentation

## 2024-06-26 DIAGNOSIS — O99213 Obesity complicating pregnancy, third trimester: Secondary | ICD-10-CM | POA: Insufficient documentation

## 2024-06-26 DIAGNOSIS — O119 Pre-existing hypertension with pre-eclampsia, unspecified trimester: Secondary | ICD-10-CM

## 2024-06-26 DIAGNOSIS — O0992 Supervision of high risk pregnancy, unspecified, second trimester: Secondary | ICD-10-CM | POA: Diagnosis present

## 2024-06-26 DIAGNOSIS — O99210 Obesity complicating pregnancy, unspecified trimester: Secondary | ICD-10-CM | POA: Diagnosis present

## 2024-06-26 DIAGNOSIS — O365930 Maternal care for other known or suspected poor fetal growth, third trimester, not applicable or unspecified: Secondary | ICD-10-CM

## 2024-06-26 DIAGNOSIS — E669 Obesity, unspecified: Secondary | ICD-10-CM

## 2024-06-26 DIAGNOSIS — O365990 Maternal care for other known or suspected poor fetal growth, unspecified trimester, not applicable or unspecified: Secondary | ICD-10-CM | POA: Insufficient documentation

## 2024-06-26 DIAGNOSIS — O10913 Unspecified pre-existing hypertension complicating pregnancy, third trimester: Secondary | ICD-10-CM | POA: Diagnosis present

## 2024-06-26 DIAGNOSIS — O88213 Thromboembolism in pregnancy, third trimester: Secondary | ICD-10-CM | POA: Diagnosis present

## 2024-06-26 DIAGNOSIS — O0993 Supervision of high risk pregnancy, unspecified, third trimester: Secondary | ICD-10-CM | POA: Insufficient documentation

## 2024-06-26 DIAGNOSIS — O10013 Pre-existing essential hypertension complicating pregnancy, third trimester: Secondary | ICD-10-CM

## 2024-06-26 DIAGNOSIS — O285 Abnormal chromosomal and genetic finding on antenatal screening of mother: Secondary | ICD-10-CM
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
## 2024-06-26 NOTE — Progress Notes (Signed)
 Mfm US  orders  Naturi Alarid, DO Attending Obstetrician & Gynecologist, [facility] for [facility], [facility]

## 2024-06-27 ENCOUNTER — Ambulatory Visit (INDEPENDENT_AMBULATORY_CARE_PROVIDER_SITE_OTHER): Payer: Self-pay | Admitting: Obstetrics & Gynecology

## 2024-06-27 ENCOUNTER — Encounter: Payer: Self-pay | Admitting: Obstetrics & Gynecology

## 2024-06-27 VITALS — BP 139/95 | HR 112 | Wt 244.0 lb

## 2024-06-27 DIAGNOSIS — O365990 Maternal care for other known or suspected poor fetal growth, unspecified trimester, not applicable or unspecified: Secondary | ICD-10-CM

## 2024-06-27 DIAGNOSIS — O119 Pre-existing hypertension with pre-eclampsia, unspecified trimester: Secondary | ICD-10-CM

## 2024-06-27 DIAGNOSIS — Z3A29 29 weeks gestation of pregnancy: Secondary | ICD-10-CM

## 2024-06-27 DIAGNOSIS — Z6791 Unspecified blood type, Rh negative: Secondary | ICD-10-CM

## 2024-06-27 DIAGNOSIS — O0993 Supervision of high risk pregnancy, unspecified, third trimester: Secondary | ICD-10-CM

## 2024-06-27 DIAGNOSIS — O26899 Other specified pregnancy related conditions, unspecified trimester: Secondary | ICD-10-CM

## 2024-06-27 LAB — POCT URINALYSIS DIPSTICK OB
Appearance: NEGATIVE
Glucose, UA: NEGATIVE
Ketones, UA: NEGATIVE
Leukocytes, UA: NEGATIVE
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
## 2024-06-30 ENCOUNTER — Inpatient Hospital Stay (HOSPITAL_COMMUNITY): Payer: MEDICAID

## 2024-06-30 ENCOUNTER — Inpatient Hospital Stay (HOSPITAL_COMMUNITY)
Admission: AD | Admit: 2024-06-30 | Discharge: 2024-07-04 | DRG: 786 | Disposition: A | Discharge status: Home / Self Care | Payer: MEDICAID | Attending: Obstetrics and Gynecology | Admitting: Obstetrics and Gynecology

## 2024-06-30 ENCOUNTER — Inpatient Hospital Stay (HOSPITAL_COMMUNITY): Payer: MEDICAID | Admitting: Anesthesiology

## 2024-06-30 ENCOUNTER — Ambulatory Visit

## 2024-06-30 ENCOUNTER — Encounter (HOSPITAL_COMMUNITY): Payer: Self-pay | Admitting: Obstetrics and Gynecology

## 2024-06-30 ENCOUNTER — Encounter (HOSPITAL_COMMUNITY): Payer: Self-pay

## 2024-06-30 ENCOUNTER — Encounter (HOSPITAL_COMMUNITY)
Admission: AD | Disposition: A | Discharge status: Home / Self Care | Payer: Self-pay | Source: Home / Self Care | Attending: Obstetrics and Gynecology

## 2024-06-30 DIAGNOSIS — Z7982 Long term (current) use of aspirin: Secondary | ICD-10-CM

## 2024-06-30 DIAGNOSIS — Z98891 History of uterine scar from previous surgery: Secondary | ICD-10-CM

## 2024-06-30 DIAGNOSIS — O1410 Severe pre-eclampsia, unspecified trimester: Principal | ICD-10-CM | POA: Diagnosis present

## 2024-06-30 DIAGNOSIS — O1092 Unspecified pre-existing hypertension complicating childbirth: Secondary | ICD-10-CM | POA: Diagnosis present

## 2024-06-30 DIAGNOSIS — O3403 Maternal care for unspecified congenital malformation of uterus, third trimester: Secondary | ICD-10-CM | POA: Diagnosis present

## 2024-06-30 DIAGNOSIS — Z3A29 29 weeks gestation of pregnancy: Secondary | ICD-10-CM

## 2024-06-30 DIAGNOSIS — O365930 Maternal care for other known or suspected poor fetal growth, third trimester, not applicable or unspecified: Secondary | ICD-10-CM | POA: Diagnosis present

## 2024-06-30 DIAGNOSIS — O26893 Other specified pregnancy related conditions, third trimester: Secondary | ICD-10-CM | POA: Diagnosis present

## 2024-06-30 DIAGNOSIS — O99344 Other mental disorders complicating childbirth: Secondary | ICD-10-CM

## 2024-06-30 DIAGNOSIS — I2699 Other pulmonary embolism without acute cor pulmonale: Secondary | ICD-10-CM | POA: Diagnosis present

## 2024-06-30 DIAGNOSIS — O88213 Thromboembolism in pregnancy, third trimester: Secondary | ICD-10-CM | POA: Diagnosis present

## 2024-06-30 DIAGNOSIS — O114 Pre-existing hypertension with pre-eclampsia, complicating childbirth: Principal | ICD-10-CM | POA: Diagnosis present

## 2024-06-30 DIAGNOSIS — O1414 Severe pre-eclampsia complicating childbirth: Secondary | ICD-10-CM

## 2024-06-30 DIAGNOSIS — O870 Superficial thrombophlebitis in the puerperium: Secondary | ICD-10-CM | POA: Diagnosis present

## 2024-06-30 DIAGNOSIS — Z6791 Unspecified blood type, Rh negative: Secondary | ICD-10-CM | POA: Diagnosis not present

## 2024-06-30 DIAGNOSIS — Z23 Encounter for immunization: Secondary | ICD-10-CM

## 2024-06-30 DIAGNOSIS — Z8249 Family history of ischemic heart disease and other diseases of the circulatory system: Secondary | ICD-10-CM

## 2024-06-30 DIAGNOSIS — O34211 Maternal care for low transverse scar from previous cesarean delivery: Secondary | ICD-10-CM | POA: Diagnosis present

## 2024-06-30 DIAGNOSIS — Z833 Family history of diabetes mellitus: Secondary | ICD-10-CM

## 2024-06-30 DIAGNOSIS — R079 Chest pain, unspecified: Secondary | ICD-10-CM | POA: Diagnosis present

## 2024-06-30 DIAGNOSIS — O0993 Supervision of high risk pregnancy, unspecified, third trimester: Secondary | ICD-10-CM

## 2024-06-30 DIAGNOSIS — Q5128 Other doubling of uterus, other specified: Secondary | ICD-10-CM | POA: Diagnosis not present

## 2024-06-30 LAB — URINALYSIS, ROUTINE W REFLEX MICROSCOPIC
Bilirubin Urine: NEGATIVE
Glucose, UA: NEGATIVE mg/dL
Hgb urine dipstick: NEGATIVE
Ketones, ur: NEGATIVE mg/dL
Leukocytes,Ua: NEGATIVE
Nitrite: NEGATIVE
Protein, ur: >=300 mg/dL — AB
Specific Gravity, Urine: 1.029 (ref 1.005–1.030)
pH: 5 (ref 5.0–8.0)

## 2024-06-30 LAB — PROTEIN / CREATININE RATIO, URINE
Creatinine, Urine: 104 mg/dL
Total Protein, Urine: >600 mg/dL

## 2024-06-30 LAB — COMPREHENSIVE METABOLIC PANEL WITH GFR
ALT: 45 U/L — ABNORMAL HIGH (ref 0–44)
AST: 55 U/L — ABNORMAL HIGH (ref 15–41)
Albumin: 2.5 g/dL — ABNORMAL LOW (ref 3.5–5.0)
Alkaline Phosphatase: 103 U/L (ref 38–126)
Anion gap: 12 (ref 5–15)
BUN: 22 mg/dL — ABNORMAL HIGH (ref 6–20)
CO2: 19 mmol/L — ABNORMAL LOW (ref 22–32)
Calcium: 9.8 mg/dL (ref 8.9–10.3)
Chloride: 103 mmol/L (ref 98–111)
Creatinine, Ser: 0.89 mg/dL (ref 0.44–1.00)
GFR, Estimated: >60 mL/min (ref >60)
Glucose, Bld: 78 mg/dL (ref 70–99)
Potassium: 4.4 mmol/L (ref 3.5–5.1)
Sodium: 134 mmol/L — ABNORMAL LOW (ref 135–145)
Total Bilirubin: 0.6 mg/dL (ref 0.0–1.2)
Total Protein: 6.3 g/dL — ABNORMAL LOW (ref 6.5–8.1)

## 2024-06-30 LAB — CBC
HCT: 42 % (ref 36.0–46.0)
Hemoglobin: 14.1 g/dL (ref 12.0–15.0)
MCH: 27.5 pg (ref 26.0–34.0)
MCHC: 33.6 g/dL (ref 30.0–36.0)
MCV: 81.9 fL (ref 80.0–100.0)
Platelets: 278 K/uL (ref 150–400)
RBC: 5.13 MIL/uL — ABNORMAL HIGH (ref 3.87–5.11)
RDW: 16.3 % — ABNORMAL HIGH (ref 11.5–15.5)
WBC: 22.3 K/uL — ABNORMAL HIGH (ref 4.0–10.5)
nRBC: 0 % (ref 0.0–0.2)

## 2024-06-30 LAB — TYPE AND SCREEN
ABO/RH(D): A NEG
Antibody Screen: POSITIVE

## 2024-06-30 LAB — TROPONIN I (HIGH SENSITIVITY): Troponin I (High Sensitivity): 8 ng/L (ref <18)

## 2024-06-30 LAB — BRAIN NATRIURETIC PEPTIDE: B Natriuretic Peptide: 32.7 pg/mL (ref 0.0–100.0)

## 2024-06-30 SURGERY — Surgical Case
Anesthesia: Spinal

## 2024-06-30 MED ORDER — LABETALOL HCL 5 MG/ML IV SOLN: 40.0000 mg | INTRAVENOUS | Status: DC | PRN

## 2024-06-30 MED ORDER — MORPHINE SULFATE (PF) 0.5 MG/ML IJ SOLN
INTRAMUSCULAR | Status: DC | PRN
  Administered 2024-06-30: 150 ug via INTRATHECAL

## 2024-06-30 MED ORDER — DOCUSATE SODIUM 100 MG PO CAPS: 100.0000 mg | ORAL_CAPSULE | Freq: Every day | ORAL | Status: DC

## 2024-06-30 MED ORDER — HYDRALAZINE HCL 20 MG/ML IJ SOLN
10.0000 mg | Freq: Once | INTRAMUSCULAR | Status: AC
Start: 2024-06-30 — End: 2024-06-30
  Administered 2024-06-30: 10 mg via INTRAVENOUS
  Filled 2024-06-30: qty 1

## 2024-06-30 MED ORDER — ACETAMINOPHEN 325 MG PO TABS: 650.0000 mg | ORAL_TABLET | ORAL | Status: DC | PRN

## 2024-06-30 MED ORDER — LABETALOL HCL 5 MG/ML IV SOLN: 80.0000 mg | INTRAVENOUS | Status: DC | PRN

## 2024-06-30 MED ORDER — FENTANYL CITRATE (PF) 100 MCG/2ML IJ SOLN
INTRAMUSCULAR | Status: AC
  Filled 2024-06-30: qty 2

## 2024-06-30 MED ORDER — SOD CITRATE-CITRIC ACID 500-334 MG/5ML PO SOLN: 30.0000 mL | Freq: Once | ORAL | Status: AC

## 2024-06-30 MED ORDER — FAMOTIDINE IN NACL 20-0.9 MG/50ML-% IV SOLN
20.0000 mg | Freq: Once | INTRAVENOUS | Status: AC
  Administered 2024-06-30: 20 mg via INTRAVENOUS
  Filled 2024-06-30: qty 50

## 2024-06-30 MED ORDER — LABETALOL HCL 5 MG/ML IV SOLN
20.0000 mg | INTRAVENOUS | Status: DC | PRN
  Administered 2024-06-30: 20 mg via INTRAVENOUS
  Filled 2024-06-30: qty 4

## 2024-06-30 MED ORDER — HYDRALAZINE HCL 20 MG/ML IJ SOLN: 10.0000 mg | INTRAMUSCULAR | Status: DC | PRN

## 2024-06-30 MED ORDER — BETAMETHASONE SOD PHOS & ACET 6 (3-3) MG/ML IJ SUSP
12.0000 mg | INTRAMUSCULAR | Status: DC
  Administered 2024-06-30: 12 mg via INTRAMUSCULAR
  Filled 2024-06-30: qty 5

## 2024-06-30 MED ORDER — HYDRALAZINE HCL 20 MG/ML IJ SOLN
10.0000 mg | INTRAMUSCULAR | Status: DC | PRN
  Administered 2024-06-30: 10 mg via INTRAVENOUS
  Filled 2024-06-30: qty 1

## 2024-06-30 MED ORDER — LABETALOL HCL 5 MG/ML IV SOLN
80.0000 mg | Freq: Once | INTRAVENOUS | Status: AC
  Administered 2024-06-30: 80 mg via INTRAVENOUS
  Filled 2024-06-30: qty 16

## 2024-06-30 MED ORDER — NIFEDIPINE ER OSMOTIC RELEASE 60 MG PO TB24
60.0000 mg | ORAL_TABLET | Freq: Two times a day (BID) | ORAL | Status: DC
Start: 2024-07-01 — End: 2024-07-01

## 2024-06-30 MED ORDER — MAGNESIUM SULFATE 40 GM/1000ML IV SOLN
1.0000 g/h | INTRAVENOUS | Status: AC
  Administered 2024-07-01: 2 g/h via INTRAVENOUS
  Filled 2024-06-30 (×2): qty 1000

## 2024-06-30 MED ORDER — MORPHINE SULFATE (PF) 0.5 MG/ML IJ SOLN
INTRAMUSCULAR | Status: AC
  Filled 2024-06-30: qty 10

## 2024-06-30 MED ORDER — MAGNESIUM SULFATE BOLUS VIA INFUSION
6.0000 g | Freq: Once | INTRAVENOUS | Status: AC
  Administered 2024-06-30: 6 g via INTRAVENOUS
  Filled 2024-06-30: qty 1000

## 2024-06-30 MED ORDER — LACTATED RINGERS IV BOLUS: 1000.0000 mL | Freq: Once | INTRAVENOUS | Status: DC

## 2024-06-30 MED ORDER — BUPIVACAINE IN DEXTROSE 0.75-8.25 % IT SOLN
INTRATHECAL | Status: DC | PRN
  Administered 2024-06-30: 1.6 mL via INTRATHECAL

## 2024-06-30 MED ORDER — SOD CITRATE-CITRIC ACID 500-334 MG/5ML PO SOLN
ORAL | Status: AC
  Administered 2024-06-30: 30 mL via ORAL
  Filled 2024-06-30: qty 30

## 2024-06-30 MED ORDER — LABETALOL HCL 5 MG/ML IV SOLN
80.0000 mg | INTRAVENOUS | Status: DC | PRN
  Administered 2024-06-30: 80 mg via INTRAVENOUS
  Filled 2024-06-30: qty 16

## 2024-06-30 MED ORDER — ONDANSETRON HCL 4 MG/2ML IJ SOLN
INTRAMUSCULAR | Status: DC | PRN
  Administered 2024-06-30: 4 mg via INTRAVENOUS

## 2024-06-30 MED ORDER — CALCIUM CARBONATE ANTACID 500 MG PO CHEW: 2.0000 | CHEWABLE_TABLET | ORAL | Status: DC | PRN

## 2024-06-30 MED ORDER — PHENYLEPHRINE HCL-NACL 20-0.9 MG/250ML-% IV SOLN
INTRAVENOUS | Status: DC | PRN
  Administered 2024-06-30: 30 ug/min via INTRAVENOUS

## 2024-06-30 MED ORDER — CEFAZOLIN SODIUM-DEXTROSE 2-4 GM/100ML-% IV SOLN
2.0000 g | INTRAVENOUS | Status: AC
  Administered 2024-06-30: 2 g via INTRAVENOUS
  Filled 2024-06-30: qty 100

## 2024-06-30 MED ORDER — LACTATED RINGERS IV SOLN: INTRAVENOUS | Status: AC

## 2024-06-30 MED ORDER — LABETALOL HCL 5 MG/ML IV SOLN: 20.0000 mg | INTRAVENOUS | Status: DC | PRN

## 2024-06-30 MED ORDER — PRENATAL MULTIVITAMIN CH: 1.0000 | ORAL_TABLET | Freq: Every day | ORAL | Status: DC

## 2024-06-30 MED ORDER — FENTANYL CITRATE (PF) 100 MCG/2ML IJ SOLN
INTRAMUSCULAR | Status: DC | PRN
  Administered 2024-06-30: 15 ug via INTRATHECAL

## 2024-06-30 MED ORDER — LACTATED RINGERS IV SOLN: INTRAVENOUS | Status: DC | PRN

## 2024-06-30 MED ORDER — LACTATED RINGERS IV SOLN: 125.0000 mL/h | INTRAVENOUS | Status: DC

## 2024-06-30 MED ORDER — LABETALOL HCL 5 MG/ML IV SOLN
40.0000 mg | INTRAVENOUS | Status: DC | PRN
  Administered 2024-06-30: 40 mg via INTRAVENOUS
  Filled 2024-06-30: qty 8

## 2024-06-30 SURGICAL SUPPLY — 32 items
CHLORAPREP W/TINT 26 (MISCELLANEOUS) ×4 IMPLANT
CLAMP UMBILICAL CORD (MISCELLANEOUS) ×2 IMPLANT
CLOTH BEACON ORANGE TIMEOUT ST (SAFETY) ×2 IMPLANT
CLSR STERI-STRIP ANTIMIC 1/2X4 (GAUZE/BANDAGES/DRESSINGS) IMPLANT
DERMABOND ADVANCED .7 DNX12 (GAUZE/BANDAGES/DRESSINGS) IMPLANT
DRSG OPSITE POSTOP 4X10 (GAUZE/BANDAGES/DRESSINGS) ×2 IMPLANT
ELECTRODE REM PT RTRN 9FT ADLT (ELECTROSURGICAL) ×2 IMPLANT
EXTRACTOR VACUUM KIWI (MISCELLANEOUS) IMPLANT
GLOVE BIOGEL PI IND STRL 6.5 (GLOVE) ×2 IMPLANT
GLOVE ECLIPSE 6.5 STRL STRAW (GLOVE) ×2 IMPLANT
GOWN STRL REUS W/TWL LRG LVL3 (GOWN DISPOSABLE) ×4 IMPLANT
KIT ABG SYR 3ML LUER SLIP (SYRINGE) IMPLANT
LIGASURE IMPACT 36 18CM CVD LR (INSTRUMENTS) ×2 IMPLANT
MAT PREVALON FULL STRYKER (MISCELLANEOUS) IMPLANT
NDL HYPO 25X5/8 SAFETYGLIDE (NEEDLE) IMPLANT
NEEDLE HYPO 22GX1.5 SAFETY (NEEDLE) IMPLANT
NEEDLE HYPO 25X5/8 SAFETYGLIDE (NEEDLE) IMPLANT
NS IRRIG 1000ML POUR BTL (IV SOLUTION) ×2 IMPLANT
PACK C SECTION WH (CUSTOM PROCEDURE TRAY) ×2 IMPLANT
PAD OB MATERNITY 4.3X12.25 (PERSONAL CARE ITEMS) ×2 IMPLANT
RETRACTOR WND ALEXIS 25 LRG (MISCELLANEOUS) IMPLANT
STRIP CLOSURE SKIN 1/2X4 (GAUZE/BANDAGES/DRESSINGS) ×2 IMPLANT
SUT MON AB 4-0 PS1 27 (SUTURE) ×2 IMPLANT
SUT PLAIN ABS 2-0 CT1 27XMFL (SUTURE) IMPLANT
SUT VIC AB 0 CT1 36 (SUTURE) ×2 IMPLANT
SUT VIC AB 0 CTX36XBRD ANBCTRL (SUTURE) ×4 IMPLANT
SUT VIC AB 2-0 CT1 (SUTURE) IMPLANT
SUT VIC AB 2-0 CT1 TAPERPNT 27 (SUTURE) IMPLANT
SYR CONTROL 10ML LL (SYRINGE) IMPLANT
TOWEL OR 17X24 6PK STRL BLUE (TOWEL DISPOSABLE) ×4 IMPLANT
TRAY FOLEY W/BAG SLVR 14FR LF (SET/KITS/TRAYS/PACK) IMPLANT
WATER STERILE IRR 1000ML POUR (IV SOLUTION) ×2 IMPLANT

## 2024-06-30 NOTE — MAU Note (Signed)
Dr dunn at bedside

## 2024-06-30 NOTE — MAU Note (Signed)
 Erica Colon is a 23 y.o. at [redacted]w[redacted]d here in MAU arrival via EMS from home. Reports onset of chest pain, lower back pain and shortness of breat since last pm, worsening this pm.    Onset of complaint: last pm Pain score: 10/10 chest and back There were no vitals filed for this visit.   FHT: 148  Lab orders placed from triage: na

## 2024-06-30 NOTE — Progress Notes (Addendum)
 Received signout from Dr. Kandis on this patient. Patient seen and evaluated and discussed plan of care. Reviewed diagnosis of severe preeclampsia and goal to get patient through steroid window if possible. Currently she has required labetalol  20/40/80 and hydralazine  10 mg. If we max out on IV meds, would require delivery. Patient is planning repeat cesarean section. Baby is known IUGR with EFW 881g (<1%) on 9/12. Patient verbalized understanding.  Her last dose of lovenox  was 10 pm last night She last ate at around 9 am this morning, no solids since. She is receiving BMZ and magnesium .  NICU and anesthesia notified of patient status and potential for delivery this evening  Cesarean section consent:   The risks, benefits and alternatives of the procedure were reviewed in detail. The risks of surgery were discussed with the patient including but were not limited to:    -- Bleeding with associated risks of blood transfusion  -- Infection which may require antibiotics -- Injury to bowel, bladder, ureters or other surrounding organs; injury to the fetus -- Need for additional procedures including hysterectomy in the event of a life-threatening hemorrhage -- Formation of adhesions; placental abnormalities wth subsequent pregnancies -- Incisional problems --Thromboembolic phenomenon such as MI, CVA or death and other postoperative/anesthesia complications.     The patient concurred with the proposed plan, giving informed written consent for the procedure.     Rollo ONEIDA Bring, MD, FACOG Obstetrician & Gynecologist, [facility] for [facility], [facility]    Addendum: Patient has now required max dose IV labetalol  20/40/80/80/80 for 300 mg total. She has received one dose of IV hydralazine  10 mg. I have just ordered a second dose of hydralazine . Given that she has maxed out on one IV med and has not responded to another, I recommend that we proceed with delivery.  NICU updated. Discussed with patient possibility of classical uterine incision given preterm and IUGR.  Rollo ONEIDA Bring, MD, FACOG Obstetrician & Gynecologist, [facility], [facility]
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
## 2024-06-30 NOTE — Anesthesia Preprocedure Evaluation (Signed)
 Anesthesia Evaluation  Patient identified by MRN, date of birth, ID band Patient awake    Reviewed: Allergy & Precautions, Patient's Chart, lab work & pertinent test results  History of Anesthesia Complications Negative for: history of anesthetic complications  Airway Mallampati: II  TM Distance: >3 FB Neck ROM: Full    Dental no notable dental hx.    Pulmonary neg pulmonary ROS   Pulmonary exam normal        Cardiovascular hypertension, Normal cardiovascular exam     Neuro/Psych   Anxiety Depression       GI/Hepatic negative GI ROS, Neg liver ROS,,,  Endo/Other  negative endocrine ROS    Renal/GU negative Renal ROS     Musculoskeletal negative musculoskeletal ROS (+)    Abdominal   Peds  Hematology negative hematology ROS (+)   Anesthesia Other Findings   Reproductive/Obstetrics (+) Pregnancy (severe preE on Mg, resistant to IV antihypertensives)                              Anesthesia Physical Anesthesia Plan  ASA: 3 and emergent  Anesthesia Plan: Spinal   Post-op Pain Management: Ofirmev  IV (intra-op)*   Induction:   PONV Risk Score and Plan: 4 or greater and Treatment may vary due to age or medical condition, Ondansetron  and Dexamethasone   Airway Management Planned: Natural Airway  Additional Equipment: None  Intra-op Plan:   Post-operative Plan:   Informed Consent: I have reviewed the patients History and Physical, chart, labs and discussed the procedure including the risks, benefits and alternatives for the proposed anesthesia with the patient or authorized representative who has indicated his/her understanding and acceptance.       Plan Discussed with: CRNA  Anesthesia Plan Comments:         Anesthesia Quick Evaluation

## 2024-07-01 ENCOUNTER — Encounter (HOSPITAL_COMMUNITY): Payer: Self-pay | Admitting: Obstetrics and Gynecology

## 2024-07-01 DIAGNOSIS — Q5128 Other doubling of uterus, other specified: Secondary | ICD-10-CM | POA: Diagnosis present

## 2024-07-01 DIAGNOSIS — Z98891 History of uterine scar from previous surgery: Secondary | ICD-10-CM

## 2024-07-01 LAB — COMPREHENSIVE METABOLIC PANEL WITH GFR
ALT: 42 U/L (ref 0–44)
AST: 41 U/L (ref 15–41)
Albumin: 2.2 g/dL — ABNORMAL LOW (ref 3.5–5.0)
Alkaline Phosphatase: 85 U/L (ref 38–126)
Anion gap: 13 (ref 5–15)
BUN: 22 mg/dL — ABNORMAL HIGH (ref 6–20)
CO2: 17 mmol/L — ABNORMAL LOW (ref 22–32)
Calcium: 8.5 mg/dL — ABNORMAL LOW (ref 8.9–10.3)
Chloride: 103 mmol/L (ref 98–111)
Creatinine, Ser: 0.89 mg/dL (ref 0.44–1.00)
GFR, Estimated: >60 mL/min (ref >60)
Glucose, Bld: 118 mg/dL — ABNORMAL HIGH (ref 70–99)
Potassium: 4.6 mmol/L (ref 3.5–5.1)
Sodium: 133 mmol/L — ABNORMAL LOW (ref 135–145)
Total Bilirubin: 0.6 mg/dL (ref 0.0–1.2)
Total Protein: 5.5 g/dL — ABNORMAL LOW (ref 6.5–8.1)

## 2024-07-01 LAB — BASIC METABOLIC PANEL WITH GFR
Anion gap: 12 (ref 5–15)
Anion gap: 13 (ref 5–15)
BUN: 24 mg/dL — ABNORMAL HIGH (ref 6–20)
BUN: 22 mg/dL — ABNORMAL HIGH (ref 6–20)
CO2: 19 mmol/L — ABNORMAL LOW (ref 22–32)
CO2: 18 mmol/L — ABNORMAL LOW (ref 22–32)
Calcium: 8 mg/dL — ABNORMAL LOW (ref 8.9–10.3)
Calcium: 7.7 mg/dL — ABNORMAL LOW (ref 8.9–10.3)
Chloride: 102 mmol/L (ref 98–111)
Chloride: 100 mmol/L (ref 98–111)
Creatinine, Ser: 0.95 mg/dL (ref 0.44–1.00)
Creatinine, Ser: 0.93 mg/dL (ref 0.44–1.00)
GFR, Estimated: >60 mL/min (ref >60)
GFR, Estimated: >60 mL/min (ref >60)
Glucose, Bld: 123 mg/dL — ABNORMAL HIGH (ref 70–99)
Glucose, Bld: 134 mg/dL — ABNORMAL HIGH (ref 70–99)
Potassium: 4.4 mmol/L (ref 3.5–5.1)
Potassium: 4.2 mmol/L (ref 3.5–5.1)
Sodium: 133 mmol/L — ABNORMAL LOW (ref 135–145)
Sodium: 131 mmol/L — ABNORMAL LOW (ref 135–145)

## 2024-07-01 LAB — CBC
HCT: 37.8 % (ref 36.0–46.0)
Hemoglobin: 12.6 g/dL (ref 12.0–15.0)
MCH: 27.4 pg (ref 26.0–34.0)
MCHC: 33.3 g/dL (ref 30.0–36.0)
MCV: 82.2 fL (ref 80.0–100.0)
Platelets: 213 K/uL (ref 150–400)
RBC: 4.6 MIL/uL (ref 3.87–5.11)
RDW: 16.6 % — ABNORMAL HIGH (ref 11.5–15.5)
WBC: 22.5 K/uL — ABNORMAL HIGH (ref 4.0–10.5)
nRBC: 0 % (ref 0.0–0.2)

## 2024-07-01 LAB — MAGNESIUM
Magnesium: 5.5 mg/dL — ABNORMAL HIGH (ref 1.7–2.4)
Magnesium: 6.7 mg/dL (ref 1.7–2.4)
Magnesium: 6.7 mg/dL (ref 1.7–2.4)

## 2024-07-01 MED ORDER — SENNOSIDES-DOCUSATE SODIUM 8.6-50 MG PO TABS
2.0000 | ORAL_TABLET | Freq: Every day | ORAL | Status: DC
Start: 2024-07-02 — End: 2024-07-04
  Administered 2024-07-02 – 2024-07-04 (×3): 2 via ORAL
  Filled 2024-07-01 (×3): qty 2

## 2024-07-01 MED ORDER — SODIUM CHLORIDE 0.9 % IR SOLN
Status: DC | PRN
  Administered 2024-07-01: 1

## 2024-07-01 MED ORDER — ACETAMINOPHEN 10 MG/ML IV SOLN
INTRAVENOUS | Status: DC | PRN
  Administered 2024-07-01: 1000 mg via INTRAVENOUS

## 2024-07-01 MED ORDER — KETOROLAC TROMETHAMINE 30 MG/ML IJ SOLN: 30.0000 mg | Freq: Four times a day (QID) | INTRAMUSCULAR | Status: DC | PRN

## 2024-07-01 MED ORDER — DROPERIDOL 2.5 MG/ML IJ SOLN: 0.6250 mg | Freq: Once | INTRAMUSCULAR | Status: DC | PRN

## 2024-07-01 MED ORDER — DIPHENHYDRAMINE HCL 50 MG/ML IJ SOLN: 12.5000 mg | INTRAMUSCULAR | Status: DC | PRN

## 2024-07-01 MED ORDER — FENTANYL CITRATE (PF) 100 MCG/2ML IJ SOLN
INTRAMUSCULAR | Status: AC
  Filled 2024-07-01: qty 2

## 2024-07-01 MED ORDER — FENTANYL CITRATE (PF) 100 MCG/2ML IJ SOLN
25.0000 ug | INTRAMUSCULAR | Status: DC | PRN
  Administered 2024-07-01 (×2): 50 ug via INTRAVENOUS

## 2024-07-01 MED ORDER — ONDANSETRON HCL 4 MG/2ML IJ SOLN: 4.0000 mg | Freq: Three times a day (TID) | INTRAMUSCULAR | Status: DC | PRN

## 2024-07-01 MED ORDER — KETOROLAC TROMETHAMINE 30 MG/ML IJ SOLN
30.0000 mg | Freq: Four times a day (QID) | INTRAMUSCULAR | Status: AC
  Administered 2024-07-01 – 2024-07-02 (×4): 30 mg via INTRAVENOUS
  Filled 2024-07-01 (×4): qty 1

## 2024-07-01 MED ORDER — OXYTOCIN-SODIUM CHLORIDE 30-0.9 UT/500ML-% IV SOLN
INTRAVENOUS | Status: DC | PRN
  Administered 2024-06-30: 30 [IU] via INTRAVENOUS

## 2024-07-01 MED ORDER — NALOXONE HCL 4 MG/10ML IJ SOLN: 1.0000 ug/kg/h | INTRAVENOUS | Status: DC | PRN

## 2024-07-01 MED ORDER — SIMETHICONE 80 MG PO CHEW: 80.0000 mg | CHEWABLE_TABLET | ORAL | Status: DC | PRN

## 2024-07-01 MED ORDER — NIFEDIPINE ER OSMOTIC RELEASE 60 MG PO TB24
60.0000 mg | ORAL_TABLET | Freq: Two times a day (BID) | ORAL | Status: DC
  Administered 2024-07-01 – 2024-07-04 (×7): 60 mg via ORAL
  Filled 2024-07-01 (×7): qty 1

## 2024-07-01 MED ORDER — NALOXONE HCL 0.4 MG/ML IJ SOLN: 0.4000 mg | INTRAMUSCULAR | Status: DC | PRN

## 2024-07-01 MED ORDER — KETOROLAC TROMETHAMINE 30 MG/ML IJ SOLN
INTRAMUSCULAR | Status: AC
  Filled 2024-07-01: qty 1

## 2024-07-01 MED ORDER — ACETAMINOPHEN 500 MG PO TABS: 1000.0000 mg | ORAL_TABLET | Freq: Four times a day (QID) | ORAL | Status: DC

## 2024-07-01 MED ORDER — SODIUM CHLORIDE 0.9% FLUSH: 3.0000 mL | INTRAVENOUS | Status: DC | PRN

## 2024-07-01 MED ORDER — STERILE WATER FOR IRRIGATION IR SOLN
Status: DC | PRN
  Administered 2024-07-01: 1

## 2024-07-01 MED ORDER — COCONUT OIL OIL: 1.0000 | TOPICAL_OIL | Status: DC | PRN

## 2024-07-01 MED ORDER — DIBUCAINE (PERIANAL) 1 % EX OINT: 1.0000 | TOPICAL_OINTMENT | CUTANEOUS | Status: DC | PRN

## 2024-07-01 MED ORDER — ENOXAPARIN SODIUM 120 MG/0.8ML IJ SOSY
110.0000 mg | PREFILLED_SYRINGE | Freq: Two times a day (BID) | INTRAMUSCULAR | Status: DC
  Administered 2024-07-01 – 2024-07-02 (×2): 110 mg via SUBCUTANEOUS
  Filled 2024-07-01 (×2): qty 0.74

## 2024-07-01 MED ORDER — SODIUM CHLORIDE 0.9 % IV SOLN: INTRAVENOUS | Status: DC | PRN

## 2024-07-01 MED ORDER — PRENATAL MULTIVITAMIN CH
1.0000 | ORAL_TABLET | Freq: Every day | ORAL | Status: DC
  Administered 2024-07-01 – 2024-07-03 (×3): 1 via ORAL
  Filled 2024-07-01 (×3): qty 1

## 2024-07-01 MED ORDER — ZOLPIDEM TARTRATE 5 MG PO TABS: 5.0000 mg | ORAL_TABLET | Freq: Every evening | ORAL | Status: DC | PRN

## 2024-07-01 MED ORDER — WITCH HAZEL-GLYCERIN EX PADS: 1.0000 | MEDICATED_PAD | CUTANEOUS | Status: DC | PRN

## 2024-07-01 MED ORDER — OXYTOCIN-SODIUM CHLORIDE 30-0.9 UT/500ML-% IV SOLN
2.5000 [IU]/h | INTRAVENOUS | Status: AC
  Administered 2024-07-01: 2.5 [IU]/h via INTRAVENOUS

## 2024-07-01 MED ORDER — IBUPROFEN 600 MG PO TABS
600.0000 mg | ORAL_TABLET | Freq: Four times a day (QID) | ORAL | Status: DC
  Administered 2024-07-02 – 2024-07-04 (×8): 600 mg via ORAL
  Filled 2024-07-01 (×8): qty 1

## 2024-07-01 MED ORDER — GABAPENTIN 100 MG PO CAPS
100.0000 mg | ORAL_CAPSULE | Freq: Three times a day (TID) | ORAL | Status: DC
  Administered 2024-07-01 – 2024-07-04 (×10): 100 mg via ORAL
  Filled 2024-07-01 (×10): qty 1

## 2024-07-01 MED ORDER — KETOROLAC TROMETHAMINE 30 MG/ML IJ SOLN
30.0000 mg | Freq: Once | INTRAMUSCULAR | Status: AC
  Administered 2024-07-01: 30 mg via INTRAVENOUS

## 2024-07-01 MED ORDER — DEXAMETHASONE SODIUM PHOSPHATE 10 MG/ML IJ SOLN
INTRAMUSCULAR | Status: DC | PRN
  Administered 2024-07-01: 10 mg via INTRAVENOUS

## 2024-07-01 MED ORDER — ACETAMINOPHEN 500 MG PO TABS
1000.0000 mg | ORAL_TABLET | Freq: Four times a day (QID) | ORAL | Status: AC
  Administered 2024-07-01 – 2024-07-02 (×4): 1000 mg via ORAL
  Filled 2024-07-01 (×4): qty 2

## 2024-07-01 MED ORDER — DIPHENHYDRAMINE HCL 25 MG PO CAPS: 25.0000 mg | ORAL_CAPSULE | ORAL | Status: DC | PRN

## 2024-07-01 MED ORDER — MENTHOL 3 MG MT LOZG: 1.0000 | LOZENGE | OROMUCOSAL | Status: DC | PRN

## 2024-07-01 MED ORDER — DIPHENHYDRAMINE HCL 25 MG PO CAPS: 25.0000 mg | ORAL_CAPSULE | Freq: Four times a day (QID) | ORAL | Status: DC | PRN

## 2024-07-01 MED ORDER — SIMETHICONE 80 MG PO CHEW
80.0000 mg | CHEWABLE_TABLET | Freq: Three times a day (TID) | ORAL | Status: DC
  Administered 2024-07-01 – 2024-07-04 (×10): 80 mg via ORAL
  Filled 2024-07-01 (×10): qty 1

## 2024-07-01 NOTE — Progress Notes (Signed)
 Date and time results received: 07/01/24 at 1341   Test: Magnesium  Critical Value: 6.7  Name of Provider Notified: Dr. Cleatus  Orders Received? Or Actions Taken?: within parameters

## 2024-07-01 NOTE — Progress Notes (Addendum)
 POD1 rLTCS d/t SIPE w/SF  Subjective: No specific complaints. No HA/BV/RUQ pain.   Objective: Blood pressure 131/70, pulse 98, temperature 97.7 F (36.5 C), temperature source Oral, resp. rate 16, height 5' 7.25 (1.708 m), weight 110.7 kg, last menstrual period 12/05/2023, SpO2 93%, unknown if currently breastfeeding.  Physical Exam:  General: alert, cooperative, and no distress Lochia: appropriate Uterine Fundus: firm Incision: Dressing c/d/i DVT Evaluation: No evidence of DVT seen on physical exam. + edema, SCDs on  Recent Labs    06/30/24 1918 07/01/24 0424  HGB 14.1 12.6  HCT 42.0 37.8    Assessment/Plan: Postpartum - Contraception: POPs - MOF: Breast  - Rh status: Rh neg - f/u Rhogam studies.  - Rubella status: Immune - Postop Hgb appropriate - Dispo: POD3 most likely - Consults: None  SIPE with SF - Continue Magnesium  x24 hours - Recheck BMP/Mg are wnl and in therapeutic range. Cr 0.95. Recheck in 6 hours.  - Procardia  60 BID, lasix /K  Rh neg - Rhogam studies pending  History of presumed PE - On lovenox  BID. Will need weight tomorrow to potentially adjust dose now that no longer pregnant - Consider CTPE since prior diagnosis was clinical.   Neonatal - In NICU   LOS: 1 day   Vina Solian 07/01/2024, 1:06 PM
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
## 2024-07-01 NOTE — Transfer of Care (Signed)
 Immediate Anesthesia Transfer of Care Note  Patient: Erica Colon  Procedure(s) Performed: CESAREAN DELIVERY  Patient Location: PACU  Anesthesia Type:Spinal  Level of Consciousness: awake, drowsy, and patient cooperative  Airway & Oxygen Therapy: Patient Spontanous Breathing  Post-op Assessment: Report given to RN and Post -op Vital signs reviewed and stable  Post vital signs: Reviewed and stable  Last Vitals:  Vitals Value Taken Time  BP 126/78 07/01/24 01:16  Temp    Pulse 86 07/01/24 01:20  Resp 17 07/01/24 01:20  SpO2 96 % 07/01/24 01:20  Vitals shown include unfiled device data.  Last Pain:  Vitals:   06/30/24 2215  TempSrc:   PainSc: 5          Complications: No notable events documented.

## 2024-07-01 NOTE — Anesthesia Postprocedure Evaluation (Signed)
 Anesthesia Post Note  Patient: Erica Colon  Procedure(s) Performed: CESAREAN DELIVERY     Patient location during evaluation: PACU Anesthesia Type: Spinal Level of consciousness: awake and alert Pain management: pain level controlled Vital Signs Assessment: post-procedure vital signs reviewed and stable Respiratory status: spontaneous breathing, nonlabored ventilation and respiratory function stable Cardiovascular status: blood pressure returned to baseline Postop Assessment: no apparent nausea or vomiting, spinal receding, no headache and no backache Anesthetic complications: no   No notable events documented.  Last Vitals:  Vitals:   07/01/24 0215 07/01/24 0240  BP: (!) 156/89 (!) 152/93  Pulse: 83 81  Resp: 12 14  Temp:  36.5 C  SpO2: 93% 96%    Last Pain:  Vitals:   07/01/24 0240  TempSrc: Oral  PainSc: 0-No pain   Pain Goal:                Epidural/Spinal Function Cutaneous sensation: Able to Wiggle Toes (07/01/24 0240), Patient able to flex knees: No (07/01/24 0240), Patient able to lift hips off bed: No (07/01/24 0240), Back pain beyond tenderness at insertion site: No (07/01/24 0240), Progressively worsening motor and/or sensory loss: No (07/01/24 0240), Bowel and/or bladder incontinence post epidural: No (07/01/24 0240)  Vertell Row

## 2024-07-01 NOTE — Discharge Summary (Signed)
 Postpartum Discharge Summary  Date of Service updated***     Patient Name: Erica Colon DOB: Dec 28, 2000 MRN: [id number]  Date of admission: 06/30/2024 Delivery date:06/30/2024 Delivering provider: DUNN, KATHLEEN T Date of discharge: 07/01/2024  Admitting diagnosis: Severe preeclampsia [O14.10] Intrauterine pregnancy: [redacted]w[redacted]d     Secondary diagnosis:  Principal Problem:   Hypertension in pregnancy, preeclampsia, severe, antepartum Active Problems:   Uterine septum   Status post repeat low transverse cesarean section  Additional problems: pulmonary embolism (tx with lovenox )   Discharge diagnosis: Preterm Pregnancy Delivered and Preeclampsia (severe)                                              Post partum procedures:{Postpartum procedures:23558} Augmentation: N/A Complications: None  Hospital course: Patient presented to the MAU, meeting criteria for pre-eclampsia with severe range blood pressures. She was given betamethasone , started on Mg, and given maximum doses of labetalol  and started on hydralazine . Given refractory blood pressures, decision was made to deliver. She was taken for repeat low transverse cesarean section. After delivery, there was immediate improvement in BP, Mg was continued, she was transferred to the antenatal unit in stable condition.   Magnesium  Sulfate received: Yes: Seizure prophylaxis BMZ received: Yes x1 Rhophylac :{Rhophylac  received:30440032} FFM:{FFM:69559966} T-DaP:{Tdap:23962} Flu: {Qol:76036} RSV Vaccine received: {RSV:31013} Transfusion:{Transfusion received:30440034}  Immunizations received: Immunization History  Administered Date(s) Administered   Influenza,inj,Quad PF,6+ Mos 08/08/2013   Influenza-Unspecified 06/06/2016, 08/23/2017   Meningococcal Conjugate 06/06/2016   PFIZER(Purple Top)SARS-COV-2 Vaccination 05/11/2020, 06/01/2020   Rho (D) Immune Globulin  03/11/2019   Tdap 03/04/2019    Physical exam  Vitals:   07/01/24  0145 07/01/24 0200 07/01/24 0215 07/01/24 0240  BP: 132/88 (!) 150/87 (!) 156/89 (!) 152/93  Pulse: 77 78 83 81  Resp: (!) 9 (!) 6 12   Temp: (!) 97.5 F (36.4 C)   97.7 F (36.5 C)  TempSrc: Oral   Oral  SpO2: 93% (!) 88% 93% 96%  Weight:      Height:       General: {Exam; general:21111117} Lochia: {Desc; appropriate/inappropriate:30686::appropriate} Uterine Fundus: {Desc; firm/soft:30687} Incision: {Exam; incision:21111123} DVT Evaluation: {Exam; dvt:2111122} Labs: Lab Results  Component Value Date   WBC 22.3 (H) 06/30/2024   HGB 14.1 06/30/2024   HCT 42.0 06/30/2024   MCV 81.9 06/30/2024   PLT 278 06/30/2024      Latest Ref Rng & Units 06/30/2024    7:18 PM  CMP  Glucose 70 - 99 mg/dL 78   BUN 6 - 20 mg/dL 22   Creatinine 9.55 - 1.00 mg/dL 9.10   Sodium 864 - 854 mmol/L 134   Potassium 3.5 - 5.1 mmol/L 4.4   Chloride 98 - 111 mmol/L 103   CO2 22 - 32 mmol/L 19   Calcium  8.9 - 10.3 mg/dL 9.8   Total Protein 6.5 - 8.1 g/dL 6.3   Total Bilirubin 0.0 - 1.2 mg/dL 0.6   Alkaline Phos 38 - 126 U/L 103   AST 15 - 41 U/L 55   ALT 0 - 44 U/L 45    Edinburgh Score:    06/13/2019   11:05 AM  Edinburgh Postnatal Depression Scale Screening Tool  I have been able to laugh and see the funny side of things. 1  I have looked forward with enjoyment to things. 1  I have blamed myself unnecessarily when things went  wrong. 3  I have been anxious or worried for no good reason. 1  I have felt scared or panicky for no good reason. 0  Things have been getting on top of me. 2  I have been so unhappy that I have had difficulty sleeping. 2  I have felt sad or miserable. 2  I have been so unhappy that I have been crying. 1  The thought of harming myself has occurred to me. 0  Edinburgh Postnatal Depression Scale Total 13      Data saved with a previous flowsheet row definition   No data recorded  After visit meds:  Allergies as of 07/01/2024   No Known Allergies   Med Rec must  be completed prior to using this [facility]***        Discharge home in stable condition Infant Feeding: {Baby feeding:23562} Infant Disposition:NICU Discharge instruction: per After Visit Summary and Postpartum booklet. Activity: Advance as tolerated. Pelvic rest for 6 weeks.  Diet: routine diet Future Appointments: Future Appointments  Date Time Provider Department Center  07/03/2024  2:45 PM [facility] PROVIDER 1 [facility]  07/03/2024  3:00 PM [facility] US5 [facility]  07/04/2024  8:50 AM Marilynn Nest, DO [facility]  07/10/2024  2:45 PM [facility] PROVIDER 1 [facility]  07/10/2024  3:00 PM [facility] US4 [facility]  07/11/2024  8:50 AM Marilynn Nest, DO [facility]  07/17/2024  3:00 PM [facility] US  [facility] None  07/17/2024  3:50 PM Jayne Vonn DEL, MD [facility]  07/21/2024  3:30 PM [facility] NURSE [facility]  07/24/2024 10:45 AM [facility] US  [facility] None  07/24/2024 11:30 AM Kizzie Suzen SAUNDERS, CNM [facility]  07/28/2024  3:30 PM [facility] NURSE [facility]  07/31/2024  3:00 PM [facility] US  [facility] None  07/31/2024  3:50 PM Marilynn Nest, DO [facility]  08/04/2024  3:30 PM [facility] NURSE [facility]  08/07/2024  3:00 PM [facility] US  [facility] None  08/07/2024  3:50 PM Marilynn Nest, DO [facility]  08/11/2024  3:30 PM [facility] NURSE [facility]  08/14/2024  2:15 PM [facility] US  [facility] None  08/14/2024  3:10 PM Marilynn Nest, DO [facility]  08/18/2024  3:30 PM [facility] NURSE [facility]  08/21/2024  3:00 PM [facility] US  [facility] None  08/21/2024  3:50 PM Marilynn Nest, DO [facility]  08/25/2024  3:30 PM [facility] NURSE [facility]  08/28/2024  3:00 PM [facility] US  [facility] None  08/28/2024  3:50 PM Marilynn Nest, DO [facility]  09/01/2024  3:00 PM [facility] IMG 2 [facility] None  09/01/2024  3:50 PM [facility] NURSE [facility]  09/01/2024  4:10 PM Jayne Vonn DEL, MD  [facility]  09/08/2024  3:30 PM [facility] NURSE [facility]   Follow up Visit: Message sent to [facility] on 9/23  Please schedule this patient for a In person postpartum visit in 4-6 weeks with the following provider: physician. Additional Postpartum F/U:Incision check 1 week and BP check 1 week  High risk pregnancy complicated by: pre-eclampsia with severe features and blood pressures refractory to treatment. Pulmonary embolism on lovenox .  Delivery mode:  C-Section, Low Transverse Anticipated Birth Control:  POPs   07/01/2024 Leeroy KATHEE Pouch, MD

## 2024-07-01 NOTE — Op Note (Signed)
 Erica Colon  PROCEDURE DATE: 07/01/2024  PREOPERATIVE DIAGNOSES:  1) Intrauterine pregnancy at [redacted]w[redacted]d weeks gestation 2) Chronic hypertension with superimposed preeclampsia with severe features refractory to IV medications 3) IUGR 4) History of cesarean section 5) Suspected pulmonary embolism in pregnancy  POSTOPERATIVE DIAGNOSES: The same  PROCEDURE: Repeat Low Transverse Cesarean Section  SURGEON:  Dr. Rollo T. Abigail, MD  ASSISTANT:  Dr. Leeroy Pouch, MD An experienced assistant was required given the standard of surgical care given the complexity of the case.  This assistant was needed for exposure, dissection, suctioning, retraction, instrument exchange, assisting with delivery with administration of fundal pressure, and for overall help during the procedure.  ANESTHESIOLOGY TEAM: Anesthesiologist: Paul Lamarr BRAVO, MD CRNA: Ivonne Olam NOVAK, CRNA  INDICATIONS: Erica Colon is a 23 y.o. [phone] at [redacted]w[redacted]d here for cesarean section secondary to the indications listed under preoperative diagnoses; please see preoperative note for further details. The patient concurred with the proposed plan, giving informed written consent for the procedure.    FINDINGS:   Viable female infant in cephalic presentation.   Apgars 7 and 8.   Amniotic fluid: clear.   Intact placenta, three vessel cord.   Normal fallopian tubes and ovaries bilaterally. Uterus with heart-shaped appearance and septum noted in the middle No significant intra-abdominal adhesions noted  ANESTHESIA: spinal  INTRAVENOUS FLUIDS: 500 ml    ESTIMATED BLOOD LOSS: 525 ml QBL  URINE OUTPUT:  250 ml  SPECIMENS: Placenta sent to pathology   COMPLICATIONS: None immediate  PROCEDURE IN DETAIL:  The patient preoperatively received intravenous antibiotics and had sequential compression devices applied to her lower extremities.  She was then taken to the operating room where spinal anesthesia was found to be adequate.  She was then placed in a dorsal supine position with a leftward tilt, and prepped and draped in a sterile manner.  A foley catheter was  placed into her bladder and attached to constant gravity.  After an adequate timeout was performed, a Pfannenstiel skin incision was made with scalpel and carried through to the underlying layer of fascia. The fascia was incised in the midline, and this incision was extended bluntly. The rectus muscles were dissected off the fascia and separated in the midline. The peritoneum was entered bluntly.   The Alexis self-retaining retractor was introduced into the abdominal cavity.  Attention was turned to the lower uterine segment where a low transverse hysterotomy was made with a scalpel and extended bilaterally bluntly.  The infant was successfully delivered, the cord was clamped and cut after cord milking. Delayed cord blood clamping was deferred as the placenta separated from the uterus at delivery. The infant was handed over to the awaiting neonatology team. Uterine massage was then administered, and the placenta delivered intact with a three-vessel cord. The uterus was then cleared of clots and debris.  The hysterotomy was closed with 0 Vicryl in a running fashion followed by a second imbricating layer of 0-vicryl.     The pelvis was cleared of all clot and debris. Hemostasis was confirmed on all surfaces.  The retractor was removed. The peritoneum was closed with 2-0 vicryl. A bleeding vein on the underside of the right rectus was made hemostatic with a couple interrupted sutures of 2-0 vicryl. The fascia was then closed using 0 Vicryl in a running fashion.  The subcutaneous layer was irrigated, and was found to be hemostatic, any areas of bleeding were cauterized with the bovie, and was reapproximated with 2-0 plain gut in a  running fashion. The skin was closed with a 4-0 monocryl subcuticular stitch. The patient tolerated the procedure well. Sponge, instrument and needle  counts were correct x 3.  She was taken to the recovery room in stable condition.   Rollo ONEIDA Bring, MD, FACOG Obstetrician & Gynecologist, [facility] for [facility], [facility]

## 2024-07-01 NOTE — Anesthesia Procedure Notes (Signed)
 Spinal  Patient location during procedure: OR Start time: 06/30/2024 11:22 PM End time: 06/30/2024 11:25 PM Reason for block: surgical anesthesia Staffing Performed: anesthesiologist  Anesthesiologist: Paul Lamarr BRAVO, MD Performed by: Paul Lamarr BRAVO, MD Authorized by: Paul Lamarr BRAVO, MD   Preanesthetic Checklist Completed: patient identified, IV checked, risks and benefits discussed, monitors and equipment checked, pre-op evaluation and timeout performed Spinal Block Patient position: sitting Prep: DuraPrep and site prepped and draped Patient monitoring: heart rate, continuous pulse ox and blood pressure Approach: midline Location: L3-4 Injection technique: single-shot Needle Needle type: Pencan  Needle gauge: 24 G Needle length: 10 cm Assessment Sensory level: T4 Events: CSF return Additional Notes Risks, benefits, and alternative discussed. Patient gave consent to procedure. Prepped and draped in sitting position. Clear CSF obtained after one needle pass. Positive terminal aspiration. No pain or paraesthesias with injection. Patient tolerated procedure well. Vital signs stable. LANEY Paul, MD

## 2024-07-02 ENCOUNTER — Other Ambulatory Visit (HOSPITAL_COMMUNITY): Payer: Self-pay

## 2024-07-02 ENCOUNTER — Inpatient Hospital Stay (HOSPITAL_COMMUNITY): Payer: MEDICAID

## 2024-07-02 ENCOUNTER — Encounter (HOSPITAL_COMMUNITY): Payer: Self-pay | Admitting: Obstetrics and Gynecology

## 2024-07-02 ENCOUNTER — Other Ambulatory Visit: Payer: Self-pay

## 2024-07-02 DIAGNOSIS — M7989 Other specified soft tissue disorders: Secondary | ICD-10-CM

## 2024-07-02 LAB — CBC
HCT: 31.7 % — ABNORMAL LOW (ref 36.0–46.0)
Hemoglobin: 10.8 g/dL — ABNORMAL LOW (ref 12.0–15.0)
MCH: 28.3 pg (ref 26.0–34.0)
MCHC: 34.1 g/dL (ref 30.0–36.0)
MCV: 83 fL (ref 80.0–100.0)
Platelets: 257 K/uL (ref 150–400)
RBC: 3.82 MIL/uL — ABNORMAL LOW (ref 3.87–5.11)
RDW: 17 % — ABNORMAL HIGH (ref 11.5–15.5)
WBC: 21.9 K/uL — ABNORMAL HIGH (ref 4.0–10.5)
nRBC: 0 % (ref 0.0–0.2)

## 2024-07-02 MED ORDER — LACTATED RINGERS IV BOLUS
500.0000 mL | Freq: Once | INTRAVENOUS | Status: AC
  Administered 2024-07-02: 500 mL via INTRAVENOUS

## 2024-07-02 MED ORDER — LABETALOL HCL 200 MG PO TABS
200.0000 mg | ORAL_TABLET | Freq: Three times a day (TID) | ORAL | Status: DC
  Administered 2024-07-02 – 2024-07-03 (×5): 200 mg via ORAL
  Filled 2024-07-02 (×5): qty 1

## 2024-07-02 MED ORDER — OXYCODONE HCL 5 MG PO TABS
10.0000 mg | ORAL_TABLET | ORAL | Status: DC | PRN
  Administered 2024-07-02 (×5): 10 mg via ORAL
  Administered 2024-07-03: 5 mg via ORAL
  Administered 2024-07-03 (×2): 10 mg via ORAL
  Filled 2024-07-02 (×8): qty 2

## 2024-07-02 MED ORDER — ENOXAPARIN SODIUM 60 MG/0.6ML IJ SOSY
55.0000 mg | PREFILLED_SYRINGE | INTRAMUSCULAR | Status: DC
  Administered 2024-07-03 – 2024-07-04 (×2): 55 mg via SUBCUTANEOUS
  Filled 2024-07-02 (×2): qty 0.6

## 2024-07-02 MED ORDER — IOHEXOL 350 MG/ML SOLN
75.0000 mL | Freq: Once | INTRAVENOUS | Status: AC | PRN
Start: 2024-07-02 — End: 2024-07-02
  Administered 2024-07-02: 75 mL via INTRAVENOUS

## 2024-07-02 MED ORDER — POTASSIUM CHLORIDE CRYS ER 20 MEQ PO TBCR
20.0000 meq | EXTENDED_RELEASE_TABLET | Freq: Every day | ORAL | Status: DC
  Administered 2024-07-02 – 2024-07-04 (×3): 20 meq via ORAL
  Filled 2024-07-02 (×3): qty 1

## 2024-07-02 MED ORDER — FUROSEMIDE 20 MG PO TABS
20.0000 mg | ORAL_TABLET | Freq: Two times a day (BID) | ORAL | Status: DC
  Administered 2024-07-02 – 2024-07-04 (×4): 20 mg via ORAL
  Filled 2024-07-02 (×4): qty 1
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
## 2024-07-02 NOTE — Progress Notes (Signed)
 Bilateral lower extremity venous duplex has been completed. Preliminary results can be found in CV Proc through chart review.   07/02/24 10:36 AM Cathlyn Collet RVT

## 2024-07-02 NOTE — Plan of Care (Signed)
  Problem: Education: Goal: Knowledge of disease or condition will improve Outcome: Progressing Goal: Knowledge of the prescribed therapeutic regimen will improve Outcome: Progressing   Problem: Fluid Volume: Goal: Peripheral tissue perfusion will improve Outcome: Progressing   Problem: Clinical Measurements: Goal: Complications related to disease process, condition or treatment will be avoided or minimized Outcome: Progressing   Problem: Education: Goal: Knowledge of disease or condition will improve Outcome: Progressing Goal: Knowledge of the prescribed therapeutic regimen will improve Outcome: Progressing Goal: Individualized Educational Video(s) Outcome: Progressing   Problem: Clinical Measurements: Goal: Complications related to the disease process, condition or treatment will be avoided or minimized Outcome: Progressing   Problem: Education: Goal: Knowledge of General Education information will improve Description: Including pain rating scale, medication(s)/side effects and non-pharmacologic comfort measures Outcome: Progressing   Problem: Health Behavior/Discharge Planning: Goal: Ability to manage health-related needs will improve Outcome: Progressing   Problem: Clinical Measurements: Goal: Ability to maintain clinical measurements within normal limits will improve Outcome: Progressing Goal: Will remain free from infection Outcome: Progressing Goal: Diagnostic test results will improve Outcome: Progressing Goal: Respiratory complications will improve Outcome: Progressing Goal: Cardiovascular complication will be avoided Outcome: Progressing   Problem: Activity: Goal: Risk for activity intolerance will decrease Outcome: Progressing   Problem: Nutrition: Goal: Adequate nutrition will be maintained Outcome: Progressing   Problem: Coping: Goal: Level of anxiety will decrease Outcome: Progressing   Problem: Elimination: Goal: Will not experience  complications related to bowel motility Outcome: Progressing Goal: Will not experience complications related to urinary retention Outcome: Progressing   Problem: Pain Managment: Goal: General experience of comfort will improve and/or be controlled Outcome: Progressing   Problem: Safety: Goal: Ability to remain free from injury will improve Outcome: Progressing   Problem: Skin Integrity: Goal: Risk for impaired skin integrity will decrease Outcome: Progressing   Problem: Education: Goal: Knowledge of the prescribed therapeutic regimen will improve Outcome: Progressing Goal: Understanding of sexual limitations or changes related to disease process or condition will improve Outcome: Progressing Goal: Individualized Educational Video(s) Outcome: Progressing   Problem: Self-Concept: Goal: Communication of feelings regarding changes in body function or appearance will improve Outcome: Progressing   Problem: Skin Integrity: Goal: Demonstration of wound healing without infection will improve Outcome: Progressing   Problem: Education: Goal: Knowledge of condition will improve Outcome: Progressing Goal: Individualized Educational Video(s) Outcome: Progressing Goal: Individualized Newborn Educational Video(s) Outcome: Progressing   Problem: Activity: Goal: Will verbalize the importance of balancing activity with adequate rest periods Outcome: Progressing Goal: Ability to tolerate increased activity will improve Outcome: Progressing   Problem: Coping: Goal: Ability to identify and utilize available resources and services will improve Outcome: Progressing   Problem: Life Cycle: Goal: Chance of risk for complications during the postpartum period will decrease Outcome: Progressing   Problem: Role Relationship: Goal: Ability to demonstrate positive interaction with newborn will improve Outcome: Progressing   Problem: Skin Integrity: Goal: Demonstration of wound healing  without infection will improve Outcome: Progressing

## 2024-07-02 NOTE — TOC Initial Note (Signed)
 Transition of Care [facility]) - Initial/Assessment Note    Patient Details  Name: Erica Colon MRN: [id number] Date of Birth: 14-Dec-2000  [facility] CM/SW Contact:    Erica Julian Daring, RN Phone Number:[phone] 07/02/2024, 4:23 PM  Clinical Narrative:                   POD 2- Csection-   CM spoke to patient and she reviewed demographics and they are correct in system. CM sent financial counselor a referral to follow up with patient.  Patient currently has no insurance. Patient unable to pay for discharge medications and CM placed MATCH in system and notified provider and also [facility] pharmacy and patient will receive medications prior to discharge to patient's room. CM unable to pay for narcotics but other medications covered. This explained to patient and she verbalized understanding.  CM does not have a PCP and does not have a preference on where she goes.  CM reached out to [facility] and they are not accepting patients . CM reached out to Primary Care at [facility] and appointment given is Nov. 12 th Wednesday at 1:00 pm with Erica Colon. Placed in epic. Patient will have hospital follow up with OB. No barriers with transportation per patient.   Expected Discharge Plan and Services Dc home   Prior Living Arrangements/Services  Lives with husband  Activities of Daily Living   ADL Screening (condition at time of admission) Independently performs ADLs?: Yes (appropriate for developmental age) Is the patient deaf or have difficulty hearing?: No Does the patient have difficulty seeing, even when wearing glasses/contacts?: No Does the patient have difficulty concentrating, remembering, or making decisions?: No    Admission diagnosis:  Severe preeclampsia [O14.10] Patient Active Problem List   Diagnosis Date Noted   Uterine septum 07/01/2024   Status post repeat low transverse cesarean section 07/01/2024   Hypertension in pregnancy,  preeclampsia, severe, antepartum 06/30/2024   Thromboembolism during pregnancy in third trimester 06/24/2024   Superficial vein thrombosis 06/21/2024   History of FGR 06/19/2024   Supervision of high-risk pregnancy 03/08/2024   Rh negative state in antepartum period 03/08/2024   Anxiety and depression 11/17/2021   PCP:  Patient, No Pcp Per Pharmacy:   [id number] [place] [place] - [address] [place] [address] Phone: [phone] Fax: [phone]  [facility] [address] [place] [address] Phone: [phone] Fax: [phone]     Social Drivers of Health (SDOH) Social History: SDOH Screenings   Food Insecurity: No Food Insecurity (07/02/2024)  Housing: Low Risk  (07/02/2024)  Transportation Needs: No Transportation Needs (07/02/2024)  Utilities: Not At Risk (07/02/2024)  Alcohol Screen: Low Risk  (03/05/2024)  Depression (PHQ2-9): Medium Risk (03/05/2024)  Financial Resource Strain: Medium Risk (03/05/2024)  Physical Activity: Insufficiently Active (03/05/2024)  Social Connections: Moderately Integrated (03/05/2024)  Stress: No Stress Concern Present (03/05/2024)  Tobacco Use: Low Risk  (07/02/2024)  Health Literacy: Adequate Health Literacy (03/05/2024)   SDOH Interventions:     Readmission Risk Interventions     No data to display
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
## 2024-07-02 NOTE — Progress Notes (Signed)
 POD2 rLTCS d/t SIPE w/SF  Subjective: Feeling better this morning now off Magnesium . Pain is controlled. Voiding. Ambulatory. Just came back from CTPE.   Objective: Blood pressure (!) 149/100, pulse 96, temperature 98.2 F (36.8 C), temperature source Oral, resp. rate 16, height 5' 7.25 (1.708 m), weight 109.3 kg, last menstrual period 12/05/2023, SpO2 98%, unknown if currently breastfeeding.  Physical Exam:  General: alert, cooperative, and no distress Lochia: appropriate Uterine Fundus: firm Incision: Dressing c/d/I. She would like to wait to have pressure dressing removed.  DVT Evaluation: No evidence of DVT seen on physical exam. + edema, SCDs on  Recent Labs    07/01/24 0424 07/02/24 0500  HGB 12.6 10.8*  HCT 37.8 31.7*    Assessment/Plan: Postpartum - Contraception: POPs - MOF: Breast  - Rh status: Rh neg - baby girl is Rh negative.  - Rubella status: Immune - Postop Hgb appropriate at 10.8.  - Dispo: POD3 most likely - Consults: None  SIPE with SF - s/p Mag. Cr stable overnight at 0.93.  - Procardia  60 BID. Start lasix  today.   - Will also start Labetalol  200 TID.   Rh neg - Baby is Rh negative. No rhogam indicated.   History of presumed PE - On lovenox  BID. Weight this morning is 109kg.  -  Checked CTPE after discussion with vascular team. They feel that if she had heart strain based on the tachycardia from PE, we would still expect to see it.  CTPE negative for PE.  - Will also check dopplers to rule out actual DVT - she has no specific symptoms but does have LE swelling.   Neonatal - In NICU   LOS: 2 days   Vina Solian 07/02/2024, 9:21 AM

## 2024-07-02 NOTE — Lactation Note (Signed)
 This note was copied from a baby's chart.  NICU Lactation Consultation Note  Patient Name: Erica Colon Unijb'd Date: 07/02/2024 Age:23 years  Reason for consult: Initial assessment; NICU baby; Exclusive pumping and bottle feeding; Infant < 5lbs; Other (Comment); Preterm <34wks (IUGR, FGR, Pre-E)  SUBJECTIVE Visited with family of 23 28/74 weeks old AGA NICU female; baby Marvia got admitted due to prematurity. Ms. Polcyn is a P2 but doesn't have a lot of experience breastfeeding. Her OBSC RN reported she was a bit hesitant about starting pumping, but after explaining the benefits of breastmilk for NICU preemies, she agreed to start pumping. Set up a DEBP, a washing/drying station, provided a pumping band in size XL for hands on pumping and assisted with her first pumping session, she was able to get small droplets of colostrum, praised her for all her efforts. Session had to be interrupted at 10 minutes due to cramping, let OBSC RN Crystal know that she was ready to go back to her room. Reviewed pumping schedule, pump settings, pumping log, secretory activation, lactogenesis III, CDC and anticipatory guidelines.  OBJECTIVE Infant data: Mother's Current Feeding Choice: Breast Milk and Donor Milk  O2 Device: CPAP FiO2 (%): 35 %  Maternal data: [id number] C-Section, Low Transverse Has patient been taught Hand Expression?: Yes Hand Expression Comments: she prefers to pump only Significant Breast History:: moderate breast changes during the pregnancy Current breast feeding challenges:: NICU admission Previous breastfeeding challenges?: Low milk supply Does the patient have breastfeeding experience prior to this delivery?: Yes How long did the patient breastfeed?: 6 weeks Pumping frequency: initiated pumping at 35 hours post-partum Pumped volume: 0 mL Flange Size: 18 Hands-free pumping top sizes: X-Large Dori) Risk factor for low/delayed milk supply:: infant separation, FGR,  IUGR, Pre-E  WIC Program: No WIC Referral Sent?: Yes What county?: [place]  ASSESSMENT Infant: Feeding Status: -- (8-12-4) Feeding method: Tube/Gavage (Bolus)  Maternal: Milk volume: Normal  INTERVENTIONS/PLAN Interventions: Interventions: Breast feeding basics reviewed; Coconut oil; DEBP; Education; [employer] brochure; CDC milk storage guidelines; CDC Guidelines for Breast Pump Cleaning; NICU Pumping Log Discharge Education: Engorgement and breast care Tools: Pump; Flanges; Hands-free pumping top; Coconut oil Pump Education: Setup, frequency, and cleaning; Milk Storage  Plan: STS once able to Pump on initiate mode every 3 hours for 15 minutes, ideally 8 pumping sessions/24 hours Switch to maintain mode once expressing +20 ml of EBM combined Bring all pump pieces to baby's room after her discharge  No other support person at this time. All questions and concerns answered, family to contact [facility] services PRN.  Consult Status: NICU follow-up NICU Follow-up type: New admission follow up; Maternal D/C visit   Alita GORMAN Crate 07/02/2024, 12:34 PM

## 2024-07-03 ENCOUNTER — Ambulatory Visit: Payer: Self-pay

## 2024-07-03 ENCOUNTER — Encounter: Admitting: Women's Health

## 2024-07-03 ENCOUNTER — Other Ambulatory Visit

## 2024-07-03 ENCOUNTER — Ambulatory Visit: Payer: Self-pay | Admitting: Obstetrics and Gynecology

## 2024-07-03 ENCOUNTER — Other Ambulatory Visit (HOSPITAL_COMMUNITY): Payer: Self-pay

## 2024-07-03 LAB — SURGICAL PATHOLOGY

## 2024-07-03 MED ORDER — SENNOSIDES-DOCUSATE SODIUM 8.6-50 MG PO TABS
2.0000 | ORAL_TABLET | Freq: Every evening | ORAL | 0 refills | Status: AC | PRN
  Filled 2024-07-03: qty 30, 15d supply, fill #0

## 2024-07-03 MED ORDER — IBUPROFEN 600 MG PO TABS
600.0000 mg | ORAL_TABLET | Freq: Four times a day (QID) | ORAL | 0 refills | Status: AC
  Filled 2024-07-03: qty 60, 15d supply, fill #0

## 2024-07-03 MED ORDER — POTASSIUM CHLORIDE CRYS ER 20 MEQ PO TBCR
20.0000 meq | EXTENDED_RELEASE_TABLET | Freq: Every day | ORAL | 0 refills | Status: AC
  Filled 2024-07-03: qty 5, 5d supply, fill #0

## 2024-07-03 MED ORDER — INFLUENZA VIRUS VACC SPLIT PF (FLUZONE) 0.5 ML IM SUSY
0.5000 mL | PREFILLED_SYRINGE | INTRAMUSCULAR | Status: AC
  Administered 2024-07-03: 0.5 mL via INTRAMUSCULAR
  Filled 2024-07-03: qty 0.5

## 2024-07-03 MED ORDER — LABETALOL HCL 200 MG PO TABS
400.0000 mg | ORAL_TABLET | Freq: Three times a day (TID) | ORAL | 0 refills | Status: AC
  Filled 2024-07-03: qty 90, 15d supply, fill #0

## 2024-07-03 MED ORDER — NIFEDIPINE ER 60 MG PO TB24
60.0000 mg | ORAL_TABLET | Freq: Two times a day (BID) | ORAL | 0 refills | Status: AC
  Filled 2024-07-03: qty 60, 30d supply, fill #0

## 2024-07-03 MED ORDER — ACETAMINOPHEN 500 MG PO TABS
1000.0000 mg | ORAL_TABLET | Freq: Four times a day (QID) | ORAL | 0 refills | Status: AC | PRN
  Filled 2024-07-03: qty 60, 8d supply, fill #0

## 2024-07-03 MED ORDER — TETANUS-DIPHTH-ACELL PERTUSSIS 5-2.5-18.5 LF-MCG/0.5 IM SUSY
0.5000 mL | PREFILLED_SYRINGE | Freq: Once | INTRAMUSCULAR | Status: AC
  Administered 2024-07-03: 0.5 mL via INTRAMUSCULAR
  Filled 2024-07-03: qty 0.5

## 2024-07-03 MED ORDER — OXYCODONE HCL 5 MG PO TABS
5.0000 mg | ORAL_TABLET | ORAL | 0 refills | Status: AC | PRN
  Filled 2024-07-03: qty 12, 2d supply, fill #0

## 2024-07-03 MED ORDER — LABETALOL HCL 200 MG PO TABS
400.0000 mg | ORAL_TABLET | Freq: Three times a day (TID) | ORAL | Status: DC
  Administered 2024-07-03 – 2024-07-04 (×2): 400 mg via ORAL
  Filled 2024-07-03 (×2): qty 2

## 2024-07-03 MED ORDER — LABETALOL HCL 200 MG PO TABS
200.0000 mg | ORAL_TABLET | Freq: Three times a day (TID) | ORAL | 0 refills | Status: DC
  Filled 2024-07-03: qty 90, 30d supply, fill #0

## 2024-07-03 MED ORDER — FUROSEMIDE 20 MG PO TABS
20.0000 mg | ORAL_TABLET | Freq: Every day | ORAL | 0 refills | Status: AC
  Filled 2024-07-03: qty 5, 5d supply, fill #0

## 2024-07-03 MED ORDER — ENOXAPARIN SODIUM 60 MG/0.6ML IJ SOSY
60.0000 mg | PREFILLED_SYRINGE | INTRAMUSCULAR | 0 refills | Status: AC
  Filled 2024-07-03: qty 18, 30d supply, fill #0

## 2024-07-03 NOTE — Progress Notes (Signed)
 Post Partum Day 3 s/p rLTCS  Subjective: no complaints, up ad lib, voiding, tolerating PO, and + flatus Denies HA, vis changes, RUQ/epigastric pain.  Objective: Blood pressure (!) 145/83, pulse (!) 104, temperature 97.9 F (36.6 C), temperature source Oral, resp. rate 18, height 5' 7.25 (1.708 m), weight 107.6 kg, last menstrual period 12/05/2023, SpO2 99%, unknown if currently breastfeeding.  Physical Exam:  General: alert, cooperative, and no distress Lochia: appropriate Uterine Fundus: firm Incision: honeycomb dressing in place, clean/dry/intact DVT Evaluation: No evidence of DVT seen on physical exam.  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 SIPE w/ SF  - s/p Mag - Mild range BP this AM despite procardia  60 BID and labetalol  200 TID, however prior to AM dose of labetalol . Will monitor PM Bps. If not controlled, plan to increase to labetalol  400 TID and discharge tomorrow. If controlled, anticipate discharge home today  4. Superficial venous thrombosis  - IV associated UE cephalic vein SVT dx earlier this pregnancy; she then developed SOB/tachycardia so was treated with therapeutic AC for presumed PE during her pregnancy  - imaging negative for PE/DVT - plan of care reviewed with vascular team who reviewed her imaging and agree that patient likely did not have PE/DVT during pregnancy so anticoagulation would not be indicated - given that she still has several risk factors for VTE, plan for prophylactic lovenox  x 6 weeks postpartum     LOS: 3 days   Kieth JAYSON Carolin 07/03/2024, 10:46 AM

## 2024-07-03 NOTE — Plan of Care (Signed)
  Problem: Education: Goal: Knowledge of disease or condition will improve Outcome: Progressing Goal: Knowledge of the prescribed therapeutic regimen will improve Outcome: Progressing   Problem: Fluid Volume: Goal: Peripheral tissue perfusion will improve Outcome: Progressing   Problem: Clinical Measurements: Goal: Complications related to disease process, condition or treatment will be avoided or minimized Outcome: Progressing   Problem: Education: Goal: Knowledge of disease or condition will improve Outcome: Progressing Goal: Knowledge of the prescribed therapeutic regimen will improve Outcome: Progressing Goal: Individualized Educational Video(s) Outcome: Progressing   Problem: Clinical Measurements: Goal: Complications related to the disease process, condition or treatment will be avoided or minimized Outcome: Progressing   Problem: Education: Goal: Knowledge of General Education information will improve Description: Including pain rating scale, medication(s)/side effects and non-pharmacologic comfort measures Outcome: Progressing   Problem: Health Behavior/Discharge Planning: Goal: Ability to manage health-related needs will improve Outcome: Progressing   Problem: Clinical Measurements: Goal: Ability to maintain clinical measurements within normal limits will improve Outcome: Progressing Goal: Will remain free from infection Outcome: Progressing Goal: Diagnostic test results will improve Outcome: Progressing Goal: Respiratory complications will improve Outcome: Progressing Goal: Cardiovascular complication will be avoided Outcome: Progressing   Problem: Activity: Goal: Risk for activity intolerance will decrease Outcome: Progressing   Problem: Nutrition: Goal: Adequate nutrition will be maintained Outcome: Progressing   Problem: Coping: Goal: Level of anxiety will decrease Outcome: Progressing   Problem: Elimination: Goal: Will not experience  complications related to bowel motility Outcome: Progressing Goal: Will not experience complications related to urinary retention Outcome: Progressing   Problem: Pain Managment: Goal: General experience of comfort will improve and/or be controlled Outcome: Progressing   Problem: Safety: Goal: Ability to remain free from injury will improve Outcome: Progressing   Problem: Skin Integrity: Goal: Risk for impaired skin integrity will decrease Outcome: Progressing   Problem: Education: Goal: Knowledge of the prescribed therapeutic regimen will improve Outcome: Progressing Goal: Understanding of sexual limitations or changes related to disease process or condition will improve Outcome: Progressing Goal: Individualized Educational Video(s) Outcome: Progressing   Problem: Self-Concept: Goal: Communication of feelings regarding changes in body function or appearance will improve Outcome: Progressing   Problem: Skin Integrity: Goal: Demonstration of wound healing without infection will improve Outcome: Progressing   Problem: Education: Goal: Knowledge of condition will improve Outcome: Progressing Goal: Individualized Educational Video(s) Outcome: Progressing Goal: Individualized Newborn Educational Video(s) Outcome: Progressing   Problem: Activity: Goal: Will verbalize the importance of balancing activity with adequate rest periods Outcome: Progressing Goal: Ability to tolerate increased activity will improve Outcome: Progressing   Problem: Coping: Goal: Ability to identify and utilize available resources and services will improve Outcome: Progressing   Problem: Life Cycle: Goal: Chance of risk for complications during the postpartum period will decrease Outcome: Progressing   Problem: Role Relationship: Goal: Ability to demonstrate positive interaction with newborn will improve Outcome: Progressing   Problem: Skin Integrity: Goal: Demonstration of wound healing  without infection will improve Outcome: Progressing

## 2024-07-04 ENCOUNTER — Other Ambulatory Visit (HOSPITAL_COMMUNITY): Payer: Self-pay

## 2024-07-04 ENCOUNTER — Encounter: Admitting: Obstetrics & Gynecology

## 2024-07-04 NOTE — Plan of Care (Signed)
  Problem: Education: Goal: Knowledge of disease or condition will improve Outcome: Adequate for Discharge Goal: Knowledge of the prescribed therapeutic regimen will improve Outcome: Adequate for Discharge   Problem: Fluid Volume: Goal: Peripheral tissue perfusion will improve Outcome: Adequate for Discharge   Problem: Clinical Measurements: Goal: Complications related to disease process, condition or treatment will be avoided or minimized Outcome: Adequate for Discharge   Problem: Education: Goal: Knowledge of disease or condition will improve Outcome: Adequate for Discharge Goal: Knowledge of the prescribed therapeutic regimen will improve Outcome: Adequate for Discharge Goal: Individualized Educational Video(s) Outcome: Adequate for Discharge   Problem: Clinical Measurements: Goal: Complications related to the disease process, condition or treatment will be avoided or minimized Outcome: Adequate for Discharge   Problem: Education: Goal: Knowledge of General Education information will improve Description: Including pain rating scale, medication(s)/side effects and non-pharmacologic comfort measures Outcome: Adequate for Discharge   Problem: Health Behavior/Discharge Planning: Goal: Ability to manage health-related needs will improve Outcome: Adequate for Discharge   Problem: Clinical Measurements: Goal: Ability to maintain clinical measurements within normal limits will improve Outcome: Adequate for Discharge Goal: Will remain free from infection Outcome: Adequate for Discharge Goal: Diagnostic test results will improve Outcome: Adequate for Discharge Goal: Respiratory complications will improve Outcome: Adequate for Discharge Goal: Cardiovascular complication will be avoided Outcome: Adequate for Discharge   Problem: Activity: Goal: Risk for activity intolerance will decrease Outcome: Adequate for Discharge   Problem: Nutrition: Goal: Adequate nutrition will be  maintained Outcome: Adequate for Discharge   Problem: Coping: Goal: Level of anxiety will decrease Outcome: Adequate for Discharge   Problem: Elimination: Goal: Will not experience complications related to bowel motility Outcome: Adequate for Discharge Goal: Will not experience complications related to urinary retention Outcome: Adequate for Discharge   Problem: Pain Managment: Goal: General experience of comfort will improve and/or be controlled Outcome: Adequate for Discharge   Problem: Safety: Goal: Ability to remain free from injury will improve Outcome: Adequate for Discharge   Problem: Skin Integrity: Goal: Risk for impaired skin integrity will decrease Outcome: Adequate for Discharge   Problem: Education: Goal: Knowledge of the prescribed therapeutic regimen will improve Outcome: Adequate for Discharge Goal: Understanding of sexual limitations or changes related to disease process or condition will improve Outcome: Adequate for Discharge Goal: Individualized Educational Video(s) Outcome: Adequate for Discharge   Problem: Self-Concept: Goal: Communication of feelings regarding changes in body function or appearance will improve Outcome: Adequate for Discharge   Problem: Skin Integrity: Goal: Demonstration of wound healing without infection will improve Outcome: Adequate for Discharge   Problem: Education: Goal: Knowledge of condition will improve Outcome: Adequate for Discharge Goal: Individualized Educational Video(s) Outcome: Adequate for Discharge Goal: Individualized Newborn Educational Video(s) Outcome: Adequate for Discharge   Problem: Activity: Goal: Will verbalize the importance of balancing activity with adequate rest periods Outcome: Adequate for Discharge Goal: Ability to tolerate increased activity will improve Outcome: Adequate for Discharge   Problem: Coping: Goal: Ability to identify and utilize available resources and services will  improve Outcome: Adequate for Discharge   Problem: Life Cycle: Goal: Chance of risk for complications during the postpartum period will decrease Outcome: Adequate for Discharge   Problem: Role Relationship: Goal: Ability to demonstrate positive interaction with newborn will improve Outcome: Adequate for Discharge   Problem: Skin Integrity: Goal: Demonstration of wound healing without infection will improve Outcome: Adequate for Discharge

## 2024-07-04 NOTE — Lactation Note (Signed)
 This note was copied from a baby's chart.  NICU Lactation Consultation Note  Patient Name: Erica Colon Date: 07/04/2024 Age:23 days  Reason for consult: Follow-up assessment; NICU baby; Exclusive pumping and bottle feeding; Infant < 5lbs; Other (Comment); Preterm <34wks (IUGR, FGR, Pre-E)  SUBJECTIVE Visited with family of 18 70/82 weeks old AGA NICU female Erica Colon; Ms. Paradiso is a P2 and reported that she's been pumping, the cramping has gotten a bit better is not as painful as before. Noticed that pumping hasn't been consistent, she's getting discharged from [facility] today. Reviewed discharge education, pump settings and the importance of consistent pumping for the onset of secretory activation and the prevention of engorgement. The [facility] office has already contacted them and scheduled an appt for 10/06; parents aware they can get a loaner pump from the hospital prior to her discharge and also let them know they can room in with baby if they wish and use the pump in baby's room 24/7.  OBJECTIVE Infant data: Mother's Current Feeding Choice: Breast Milk and Donor Milk  O2 Device: Ventilator FiO2 (%): 26 %  Maternal data: G2P1102 C-Section, Low Transverse Pumping frequency: 3 times/24 hours Pumped volume: 5 mL  WIC Program: No WIC Referral Sent?: Yes What county?: [place] Pump:  (Offered loaner pump on 07/04/2024)  ASSESSMENT Infant: Feeding Status: Scheduled 9-12-3-6 Feeding method: Tube/Gavage (Bolus)  Maternal: Milk volume: Normal  INTERVENTIONS/PLAN Interventions: Interventions: Breast feeding basics reviewed; Coconut oil; DEBP; Education Discharge Education: Engorgement and breast care Tools: 60F feeding tube / Syringe  Plan: STS once able to Pump on initiate mode every 3 hours for 15 minutes, ideally 8 pumping sessions/24 hours Switch to maintain mode once expressing +20 ml of EBM combined Bring all pump pieces to baby's room after her discharge    FOB present. All questions and concerns answered, family to contact [facility] services PRN.   Consult Status: NICU follow-up NICU Follow-up type: Verify absence of engorgement; Verify onset of copious milk   Karron Alvizo S Hansford Hirt 07/04/2024, 12:03 PM

## 2024-07-07 ENCOUNTER — Other Ambulatory Visit

## 2024-07-08 ENCOUNTER — Encounter: Payer: Self-pay | Admitting: Women's Health

## 2024-07-10 ENCOUNTER — Ambulatory Visit (INDEPENDENT_AMBULATORY_CARE_PROVIDER_SITE_OTHER): Payer: Self-pay | Admitting: Obstetrics & Gynecology

## 2024-07-10 ENCOUNTER — Encounter: Payer: Self-pay | Admitting: Obstetrics & Gynecology

## 2024-07-10 ENCOUNTER — Ambulatory Visit: Payer: Self-pay

## 2024-07-10 ENCOUNTER — Other Ambulatory Visit: Payer: Self-pay

## 2024-07-10 VITALS — BP 134/87 | HR 94 | Ht 62.0 in | Wt 226.0 lb

## 2024-07-10 DIAGNOSIS — O1415 Severe pre-eclampsia, complicating the puerperium: Secondary | ICD-10-CM

## 2024-07-10 DIAGNOSIS — O1410 Severe pre-eclampsia, unspecified trimester: Secondary | ICD-10-CM

## 2024-07-10 DIAGNOSIS — Z3009 Encounter for other general counseling and advice on contraception: Secondary | ICD-10-CM

## 2024-07-10 DIAGNOSIS — R300 Dysuria: Secondary | ICD-10-CM | POA: Diagnosis not present

## 2024-07-10 DIAGNOSIS — Z48816 Encounter for surgical aftercare following surgery on the genitourinary system: Secondary | ICD-10-CM

## 2024-07-10 DIAGNOSIS — Z4889 Encounter for other specified surgical aftercare: Secondary | ICD-10-CM

## 2024-07-10 DIAGNOSIS — Z3A00 Weeks of gestation of pregnancy not specified: Secondary | ICD-10-CM

## 2024-07-10 LAB — POCT URINALYSIS DIPSTICK OB
Blood, UA: NEGATIVE
Glucose, UA: NEGATIVE
Ketones, UA: NEGATIVE
Leukocytes, UA: NEGATIVE
Nitrite, UA: NEGATIVE

## 2024-07-10 NOTE — Progress Notes (Signed)
    PostOp Visit Note  Erica Colon is a 23 y.o. G49P1102 female who presents for a postoperative visit. She is 1 week postop following a Repeat C-section completed on 9/23 due to preE with severe features and desired repeat   Today she notes she has been doing okay.  She does note some mild headaches mostly in the back of her head.  Minimal improvement with medication Denies fever or chills.  Tolerating gen diet.  +Flatus, Regular BMs.  Denies incisional pain.  Minimal lochia   Review of Systems She has noted a little discomfort when she voids.  Objective:  BP 134/87 (BP Location: Right Arm, Patient Position: Sitting, Cuff Size: Large)   Pulse 94   Ht 5' 2 (1.575 m)   Wt 226 lb (102.5 kg)   LMP 12/05/2023   Breastfeeding Yes   BMI 41.34 kg/m    Physical Examination:  GENERAL ASSESSMENT: well developed and well nourished SKIN: normal color, no lesions CHEST: normal air exchange, respiratory effort normal with no retractions HEART: regular rate and rhythm ABDOMEN: soft, no rebound or guarding, appropriately tender INCISION: steri strips removed, C/D/I EXTREMITY: no edema PSYCH: mood appropriate, normal affect       Assessment:    Postop visit Preeclampsia with severe features Family planning   Plan:   -Meeting milestones appropriately, reviewed postoperative care of incision -Plan for UA and culture to rule out underlying UTI - Patient to continue with current blood pressure medication []  Should headaches continue may consider transitioning off Procardia  to other antihypertensive  -Discussed contraceptive options: She noted weight gain with Depo and significant mood changes and not feeling like herself with Nexplanon .  She is afraid of the IUD.  Reviewed risk benefits of each option and patient plans to review on her own, not yet sure  Frederica Chrestman, DO Attending Obstetrician & Gynecologist, Faculty Practice Center for [employer], [facility]

## 2024-07-11 ENCOUNTER — Encounter: Admitting: Obstetrics & Gynecology

## 2024-07-12 LAB — URINE CULTURE

## 2024-07-14 ENCOUNTER — Other Ambulatory Visit

## 2024-07-15 ENCOUNTER — Ambulatory Visit: Payer: Self-pay | Admitting: Obstetrics & Gynecology

## 2024-07-17 ENCOUNTER — Ambulatory Visit

## 2024-07-17 ENCOUNTER — Other Ambulatory Visit

## 2024-07-17 ENCOUNTER — Encounter: Admitting: Obstetrics & Gynecology

## 2024-07-17 NOTE — Lactation Note (Signed)
 This note was copied from a baby's chart.  NICU Lactation Consultation Note  Patient Name: Erica Colon Unijb'd Date: 07/17/2024 Age:23 wk.o.  Reason for consult: Weekly NICU follow-up; NICU baby; Exclusive pumping and bottle feeding; Infant < 5lbs; Other (Comment); Preterm <34wks (IUGR, FGR, Pre-E)  SUBJECTIVE Visited with family of 90 30/50 weeks old AGA NICU female Erica Colon; Erica Colon is a P2 and reported she's still pumping but it's a work in progress; praised her for all her efforts. Noticed that pumping is still not consistent, baby is mostly on donor milk. Re-educated about the importance of consistent pumping to protect her supply and reviewed strategies to increase it such us  consistent pumping, using a hospital grade pump, power pumping and STS care.   OBJECTIVE Infant data: Mother's Current Feeding Choice: Breast Milk and Donor Milk  O2 Device: CPAP FiO2 (%): 25 %  Infant feeding assessment No data recorded  Maternal data: G2P1102 C-Section, Low Transverse Pumping frequency: 4 times/24 hours Pumped volume: 15 mL  WIC Program: No WIC Referral Sent?: Yes What county?: [place] Pump: Personal, Hands Free (Medela Freestyle wearable)  ASSESSMENT Infant: Feeding Status: Continuous gastric feedings Feeding method: Continuous Gastric  Maternal: Milk volume: Low  INTERVENTIONS/PLAN Interventions: Interventions: Breast feeding basics reviewed; Coconut oil; DEBP; Education  Plan: STS around care times, pump right after Pump every 3 hours on maintain mode for 15-30 minutes, ideally 8 pumping sessions/24 hours Try power pumping once/day   FOB present and supportive. All questions and concerns answered, family to contact [facility] services PRN.  Consult Status: NICU follow-up NICU Follow-up type: Weekly NICU follow up   Erica Colon 07/17/2024, 10:37 AM

## 2024-07-21 ENCOUNTER — Other Ambulatory Visit

## 2024-07-24 ENCOUNTER — Encounter: Admitting: Women's Health

## 2024-07-24 ENCOUNTER — Other Ambulatory Visit

## 2024-07-28 ENCOUNTER — Ambulatory Visit

## 2024-07-31 ENCOUNTER — Encounter: Admitting: Women's Health

## 2024-07-31 ENCOUNTER — Encounter: Admitting: Obstetrics & Gynecology

## 2024-07-31 ENCOUNTER — Other Ambulatory Visit

## 2024-08-01 ENCOUNTER — Encounter: Payer: Self-pay | Admitting: Cardiology

## 2024-08-01 ENCOUNTER — Ambulatory Visit (INDEPENDENT_AMBULATORY_CARE_PROVIDER_SITE_OTHER): Admitting: Cardiology

## 2024-08-01 VITALS — BP 160/120 | HR 82 | Ht 62.0 in | Wt 225.9 lb

## 2024-08-01 DIAGNOSIS — I10 Essential (primary) hypertension: Secondary | ICD-10-CM

## 2024-08-01 DIAGNOSIS — O1410 Severe pre-eclampsia, unspecified trimester: Secondary | ICD-10-CM

## 2024-08-01 DIAGNOSIS — Z8759 Personal history of other complications of pregnancy, childbirth and the puerperium: Secondary | ICD-10-CM | POA: Diagnosis not present

## 2024-08-01 MED ORDER — CHLORTHALIDONE 25 MG PO TABS: 12.5000 mg | ORAL_TABLET | Freq: Every day | ORAL | 3 refills | Status: AC

## 2024-08-01 NOTE — Patient Instructions (Signed)
 Medication Instructions:   START CHLORTHALIDONE 12.5 MG ONCE DAILY=TAKE 1/2 OF THE 25 MG TABLET ONCE DAILY  *If you need a refill on your cardiac medications before your next appointment, please call your pharmacy*   Follow-Up: At [facility], you and your health needs are our priority.  As part of our continuing mission to provide you with exceptional heart care, our providers are all part of one team.  This team includes your primary Cardiologist (physician) and Advanced Practice Providers or APPs (Physician Assistants and Nurse Practitioners) who all work together to provide you with the care you need, when you need it.  Your next appointment:   16 week(s)  Provider:   DUB HUNTSMAN

## 2024-08-04 ENCOUNTER — Other Ambulatory Visit
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
## 2024-08-07 ENCOUNTER — Other Ambulatory Visit

## 2024-08-07 ENCOUNTER — Ambulatory Visit: Payer: Self-pay | Admitting: Women's Health

## 2024-08-07 ENCOUNTER — Encounter: Payer: Self-pay | Admitting: Obstetrics & Gynecology

## 2024-08-07 ENCOUNTER — Ambulatory Visit: Payer: Self-pay | Admitting: Obstetrics & Gynecology

## 2024-08-07 ENCOUNTER — Encounter: Admitting: Obstetrics & Gynecology

## 2024-08-07 DIAGNOSIS — F419 Anxiety disorder, unspecified: Secondary | ICD-10-CM

## 2024-08-07 DIAGNOSIS — L304 Erythema intertrigo: Secondary | ICD-10-CM | POA: Diagnosis not present

## 2024-08-07 DIAGNOSIS — I10 Essential (primary) hypertension: Secondary | ICD-10-CM | POA: Diagnosis not present

## 2024-08-07 MED ORDER — NYSTATIN 100000 UNIT/GM EX POWD: CUTANEOUS | 0 refills | Status: AC

## 2024-08-07 MED ORDER — SERTRALINE HCL 50 MG PO TABS: 50.0000 mg | ORAL_TABLET | Freq: Every day | ORAL | 11 refills | Status: DC

## 2024-08-07 NOTE — Progress Notes (Signed)
 POSTPARTUM VISIT Patient name: Erica Colon MRN [id number]  Date of birth: June 10, 2001 Chief Complaint:   Postpartum Care  History of Present Illness:   Erica Colon is a 23 y.o. G93P1102 female being seen today for a postpartum visit. She is 6 weeks postpartum following a repeat cesarean section, low transverse incision at [redacted]w[redacted]d for preE with severe features  Pregnancy complicated by -cHTN with supreimposed preE -superficial VTE in RUE  She reports that she has been struggling with anxiety.  On some days she does not want to do anything and feels sad.  Denies SI or HI.  Last pap smear: 03/2022    Postpartum course has been complicated by superficial RUE.  Bleeding thin lochia. Bowel function is normal. Bladder function is normal. Urinary incontinence? No, fecal incontinence? No Patient is not sexually active. Last sexual activity: prior to pregnancy.   Desired contraception: vasectomy. Patient does not want a pregnancy in the future.   Desired family size is 2 children.   Upstream - 08/07/24 1108       Pregnancy Intention Screening   Does the patient want to become pregnant in the next year? No    Does the patient's partner want to become pregnant in the next year? No    Would the patient like to discuss contraceptive options today? No      Contraception Wrap Up   Current Method Abstinence    End Method Vasectomy         The pregnancy intention screening data noted above was reviewed. Potential methods of contraception were discussed. The patient elected to proceed with Vasectomy.  Edinburgh Postpartum Depression Screening: Positive    Edinburgh Postnatal Depression Scale - 08/07/24 1115       Edinburgh Postnatal Depression Scale:  In the Past 7 Days   I have been able to laugh and see the funny side of things. 0    I have looked forward with enjoyment to things. 1    I have blamed myself unnecessarily when things went wrong. 2    I have been anxious or worried  for no good reason. 3    I have felt scared or panicky for no good reason. 3    Things have been getting on top of me. 2    I have been so unhappy that I have had difficulty sleeping. 2    I have felt sad or miserable. 2    I have been so unhappy that I have been crying. 2    The thought of harming myself has occurred to me. 0    Edinburgh Postnatal Depression Scale Total 17          Baby's course has been complicated by preterm delivery 2/2 preeclampsia- baby in NICU. Infant has a pediatrician/family doctor? Yes.  Childcare strategy if returning to work/school: pt staying home with baby.  Pt has material needs met for her and baby: Yes.    Review of Systems:   Pertinent items are noted in HPI Denies Abnormal vaginal discharge w/ itching/odor/irritation, headaches, visual changes, shortness of breath, chest pain, abdominal pain, severe nausea/vomiting, or problems with urination or bowel movements. Pertinent History Reviewed:  Reviewed past medical,surgical, obstetrical and family history.  Reviewed problem list, medications and allergies. OB History  Gravida Para Term Preterm AB Living  2 2 1 1  0 2  SAB IAB Ectopic Multiple Live Births  0 0 0 0 2    # Outcome Date GA Lbr Len/2nd Weight Sex  Type Anes PTL Lv  2 Preterm 06/30/24 [redacted]w[redacted]d  2 lb 5 oz (1.05 kg) F CS-LTranv Spinal  LIV  1 Term 05/09/19 [redacted]w[redacted]d   F CS-LTranv Spinal  LIV     Complications: Breech presentation, Gestational hypertension   Physical Assessment:   Vitals:   08/07/24 1105 08/07/24 1109  BP: (!) 143/89 (!) 135/92  Pulse: 84   Weight: 226 lb 6.4 oz (102.7 kg)   Height: 5' 2 (1.575 m)   Body mass index is 41.41 kg/m.       Physical Examination:   General appearance: alert, well appearing, and in no distress  Mental status: normal mood, behavior, speech, dress, motor activity, and thought processes  Skin: warm & dry   Cardiovascular: RRR  Respiratory: CTAB  Breasts: no masses or abnormalities noted    Abdomen: soft, non-tender  Incision: well healed, slight yeast-like odor and mild erythema  Pelvic: VULVA: normal appearing vulva with no masses, tenderness or lesions, patient declined speculum exam.  No uterine or adnexal tenderness noted on bimanual exam  Extremities: no edema  Chaperone: Aleck Blase          Assessment & Plan:  1) Postpartum exam 2) 6 wks s/p repeat cesarean section, low transverse incision 3) Anxiety - Referral to behavioral health sent in  -start Zoloft  50mg  daily, f/u in 3 mos 4) Contraception management: vasectomy scheduled, patient plans for abstinence until then 5) Intertrigo- nystatin powder prn  Essential components of care per ACOG recommendations:  1.  Mood and well being:  If positive depression screen, discussed and plan developed.  If using tobacco we discussed reduction/cessation and risk of relapse If current substance abuse, we discussed and referral to local resources was offered.   2. Sexuality, contraception and birth spacing Provided guidance regarding sexuality, management of dyspareunia, and resumption of intercourse Discussed avoiding interpregnancy interval <91mths and recommended birth spacing of 18 months  3. Sleep and fatigue Discussed coping options for fatigue and sleep disruption Encouraged family/partner/community support of 4 hrs of uninterrupted sleep to help with mood and fatigue  4. Physical recovery  Encouraged pelvic floor exercises Due to hip pain discussed stretching exercises may also consider chiropractor if needed.  No acute abnormalities were noted on exam Patient is safe to resume physical activity. Discussed attainment of healthy weight.  6.  Chronic disease management Discussed pregnancy complications if any, and their implications for future childbearing and long-term maternal health. Chronic HTN- continue with current medication, pt seen by [facility] cardiology and has f/u with PCP  7. Health  maintenance Mammogram at 23yo or earlier if indicated Pap smear- 2023  Meds:  Meds ordered this encounter  Medications   sertraline  (ZOLOFT ) 50 MG tablet    Sig: Take 1 tablet (50 mg total) by mouth daily.    Dispense:  30 tablet    Refill:  11   nystatin (MYCOSTATIN/NYSTOP) powder    Sig: Appy to affected area as needed up to 3x daily as needed    Dispense:  15 g    Refill:  0    Follow-up: Return in about 3 months (around 11/07/2024) for Medication follow up.   Orders Placed This Encounter  Procedures   Amb ref to Integrated Behavioral Health    Norberta Stobaugh, DO Attending Obstetrician & Gynecologist, [facility] for [facility], [facility]

## 2024-08-11 ENCOUNTER — Other Ambulatory Visit

## 2024-08-13 ENCOUNTER — Telehealth: Payer: Self-pay | Admitting: Clinical

## 2024-08-13 NOTE — Telephone Encounter (Signed)
Attempt call regarding referral; Left HIPPA-compliant message to call back [name] from [facility] at [facility] at  [phone] ([name]'s office).    

## 2024-08-14 ENCOUNTER — Other Ambulatory Visit

## 2024-08-14 ENCOUNTER — Encounter: Admitting: Obstetrics & Gynecology

## 2024-08-18 ENCOUNTER — Other Ambulatory Visit

## 2024-08-19 ENCOUNTER — Telehealth: Payer: Self-pay | Admitting: Clinical

## 2024-08-19 NOTE — Telephone Encounter (Signed)
Attempt call regarding referral; Left HIPPA-compliant message to call back [name] from [facility] at [facility] at  [phone] ([name]'s office).    

## 2024-08-20 ENCOUNTER — Ambulatory Visit: Payer: Self-pay | Admitting: Family Medicine

## 2024-08-21 ENCOUNTER — Other Ambulatory Visit

## 2024-08-21 ENCOUNTER — Encounter: Admitting: Obstetrics & Gynecology

## 2024-08-25 ENCOUNTER — Other Ambulatory Visit

## 2024-08-25 NOTE — Lactation Note (Signed)
 This note was copied from a baby's chart.  NICU Lactation Consultation Note  Patient Name: Erica Colon Unijb'd Date: 08/25/2024 Age:23 years old  Reason for consult: Weekly NICU follow-up; NICU baby; Exclusive pumping and bottle feeding; Other (Comment); Early term 73-38.6wks; Infant < 6lbs (IUGR, FGR, Pre-E)  SUBJECTIVE Visited with family of 23 51/37 weeks old AGA NICU female [name]; Ms. Spear is a P2 and reported she's no longer pumping; she stopped about a month ago and never got engorged, her breast are back to normal. Ezella Salmon is now on Elecare 27 calorie formula on continuous gavage feedings. Lactation services are completed at this time.  OBJECTIVE Infant data: Mother's Current Feeding Choice: Formula  O2 Device: Room Air  Infant feeding assessment IDFTS - Readiness: 3   Maternal data: G2P1102 C-Section, Low Transverse No data recorded WIC Program: No WIC Referral Sent?: Yes What county?: [place] Pump: Personal, Hands Free (Medela Freestyle wearable)  ASSESSMENT Infant: Feeding Status: Continuous gastric feedings Feeding method: Continuous Gastric  Maternal: No longer pumping  INTERVENTIONS/PLAN Interventions: Interventions: Education  Plan: Consult Status: Complete   Mignonne Afonso S Jadelynn Boylan 08/25/2024, 5:00 PM

## 2024-08-28 ENCOUNTER — Encounter: Admitting: Obstetrics & Gynecology

## 2024-08-28 ENCOUNTER — Other Ambulatory Visit

## 2024-09-01 ENCOUNTER — Other Ambulatory Visit: Admitting: Radiology

## 2024-09-01 ENCOUNTER — Other Ambulatory Visit: Payer: Self-pay | Admitting: *Deleted

## 2024-09-01 ENCOUNTER — Other Ambulatory Visit

## 2024-09-01 ENCOUNTER — Encounter: Admitting: Obstetrics & Gynecology

## 2024-09-01 DIAGNOSIS — F419 Anxiety disorder, unspecified: Secondary | ICD-10-CM

## 2024-09-01 MED ORDER — SERTRALINE HCL 50 MG PO TABS: 50.0000 mg | ORAL_TABLET | Freq: Every day | ORAL | 3 refills | Status: AC

## 2024-09-08 ENCOUNTER — Other Ambulatory Visit

## 2024-09-30 NOTE — BH Specialist Note (Signed)
 Pt did not arrive to video visit and did not answer the phone; Left HIPPA-compliant message to call back Warren from [employer] for [employer] at [facility] at  [phone] [facility] office).  ?; left MyChart message for patient.  ? ?

## 2024-10-13 ENCOUNTER — Ambulatory Visit: Payer: Self-pay | Admitting: Clinical

## 2024-10-13 DIAGNOSIS — Z91199 Patient's noncompliance with other medical treatment and regimen due to unspecified reason: Secondary | ICD-10-CM

## 2024-11-07 ENCOUNTER — Ambulatory Visit: Admitting: Adult Health

## 2024-11-21 ENCOUNTER — Ambulatory Visit: Admitting: Cardiology
